# Patient Record
Sex: Male | Born: 1945 | Hispanic: No | Marital: Married | Smoking: Never smoker
Health system: Southern US, Community
[De-identification: ages and names within clinical notes are randomized; demographics above are authoritative.]

## PROBLEM LIST (undated history)

## (undated) DIAGNOSIS — T7840XA Allergy, unspecified, initial encounter: Secondary | ICD-10-CM

## (undated) DIAGNOSIS — R001 Bradycardia, unspecified: Secondary | ICD-10-CM

## (undated) DIAGNOSIS — H409 Unspecified glaucoma: Secondary | ICD-10-CM

## (undated) DIAGNOSIS — Z8739 Personal history of other diseases of the musculoskeletal system and connective tissue: Secondary | ICD-10-CM

## (undated) DIAGNOSIS — M5430 Sciatica, unspecified side: Secondary | ICD-10-CM

## (undated) DIAGNOSIS — K648 Other hemorrhoids: Secondary | ICD-10-CM

## (undated) DIAGNOSIS — R972 Elevated prostate specific antigen [PSA]: Secondary | ICD-10-CM

## (undated) DIAGNOSIS — J309 Allergic rhinitis, unspecified: Secondary | ICD-10-CM

## (undated) DIAGNOSIS — E785 Hyperlipidemia, unspecified: Secondary | ICD-10-CM

## (undated) DIAGNOSIS — Z8619 Personal history of other infectious and parasitic diseases: Secondary | ICD-10-CM

## (undated) HISTORY — DX: Hyperlipidemia, unspecified: E78.5

## (undated) HISTORY — DX: Elevated prostate specific antigen (PSA): R97.20

## (undated) HISTORY — DX: Bradycardia, unspecified: R00.1

## (undated) HISTORY — DX: Other hemorrhoids: K64.8

## (undated) HISTORY — PX: COLONOSCOPY: SHX174

## (undated) HISTORY — DX: Allergic rhinitis, unspecified: J30.9

## (undated) HISTORY — DX: Allergy, unspecified, initial encounter: T78.40XA

## (undated) HISTORY — DX: Personal history of other infectious and parasitic diseases: Z86.19

---
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
## 2006-04-15 LAB — HM COLONOSCOPY

## 2012-01-25 LAB — HEPATIC FUNCTION PANEL
ALT: 15 U/L (ref 10–40)
Alkaline Phosphatase: 59 U/L (ref 25–125)

## 2012-01-25 LAB — BASIC METABOLIC PANEL
BUN: 15 mg/dL (ref 4–21)
Creatinine: 0.8 mg/dL (ref 0.6–1.3)
Potassium: 4.4 mmol/L (ref 3.4–5.3)
Sodium: 139 mmol/L (ref 137–147)

## 2012-01-25 LAB — LIPID PANEL
Cholesterol: 180 mg/dL (ref 0–200)
LDL Cholesterol: 105 mg/dL
Triglycerides: 87 mg/dL (ref 40–160)

## 2012-03-17 LAB — HM COLONOSCOPY: HM Colonoscopy: NORMAL

## 2012-07-31 ENCOUNTER — Ambulatory Visit (INDEPENDENT_AMBULATORY_CARE_PROVIDER_SITE_OTHER): Payer: 59 | Admitting: Internal Medicine

## 2012-07-31 ENCOUNTER — Ambulatory Visit (INDEPENDENT_AMBULATORY_CARE_PROVIDER_SITE_OTHER)
Admission: RE | Admit: 2012-07-31 | Discharge: 2012-07-31 | Disposition: A | Discharge status: Home / Self Care | Payer: 59 | Source: Ambulatory Visit | Attending: Internal Medicine | Admitting: Internal Medicine

## 2012-07-31 ENCOUNTER — Encounter: Payer: Self-pay | Admitting: Internal Medicine

## 2012-07-31 VITALS — BP 142/74 | HR 71 | Temp 98.5°F | Ht 72.0 in | Wt 186.8 lb

## 2012-07-31 DIAGNOSIS — J189 Pneumonia, unspecified organism: Secondary | ICD-10-CM

## 2012-07-31 DIAGNOSIS — J309 Allergic rhinitis, unspecified: Secondary | ICD-10-CM

## 2012-07-31 MED ORDER — CETIRIZINE HCL 10 MG PO TABS: 10.0000 mg | ORAL_TABLET | Freq: Every day | ORAL | Status: DC

## 2012-07-31 MED ORDER — AZITHROMYCIN 250 MG PO TABS: ORAL_TABLET | ORAL | Status: DC

## 2012-07-31 NOTE — Progress Notes (Signed)
Subjective:    Patient ID: Adam Fleming, male    DOB: 1945-01-17, 67 y.o.   MRN: [id number]  HPI  New patient to me in my practice, here for acute "cold" symptoms Appointment to establish care in December 2014 reviewed, recent move to [place] area from [place] to be close to family  Patient presents today with cold symptoms x 1 week. 1 week ago, he woke up with a sore throat and cough. He has also had sneezing, rhinorrhea, sinus pressure, nasal congestion, and bilateral ear pain and pressure. The sore throat has improved over the past week, but the cough has progressed. He began to feel better 2 days ago, but yesterday he began to feel worse with body aches, fatigue, and worsening cough productive of yellow sputum. He has been taking mucinex with some relief of congestion. He denies fever and chills, SOB and wheeze, but +exposure to ill contacts. Patient claims that he has had problems with allergies and sinuses for the past 4-5 years but he does not take any medications for this. He denies history of COPD and asthma, and he is a never smoker.  Past Medical History  Diagnosis Date  . History of chicken pox    Family History  Problem Relation Age of Onset  . Colon cancer Mother   . Heart disease Father     had valve replacement  . Colon cancer Maternal Aunt   . Colon cancer Maternal Grandmother    History  Substance Use Topics  . Smoking status: Never Smoker   . Smokeless tobacco: Not on file  . Alcohol Use: Yes    Review of Systems  Constitutional: Positive for fever (LGF) and fatigue. Negative for unexpected weight change.  HENT: Positive for sneezing, postnasal drip and sinus pressure. Negative for ear pain.   Respiratory: Positive for cough. Negative for chest tightness, shortness of breath and wheezing.   Cardiovascular: Negative for chest pain, palpitations and leg swelling.  Gastrointestinal: Negative for nausea, vomiting, abdominal pain, diarrhea and  constipation.  Skin: Negative for rash.  Neurological: Negative for headaches.  Otherwise negative or as stated in HPI      Objective:   Physical Exam  Constitutional: He is oriented to person, place, and time. He appears well-developed and well-nourished.  HENT:  Head: Normocephalic and atraumatic.  Right Ear: External ear normal.  Left Ear: External ear normal.  TM's- clear effusion bilaterally Nose- nasal turbinates are edematous and erythematous   Eyes: EOM are normal. Pupils are equal, round, and reactive to light. Right eye exhibits no discharge. Left eye exhibits no discharge.  Neck: Normal range of motion. Neck supple. No thyromegaly present.  Mildly tender cervical adenopathy noted in anterior chain bilaterally  Cardiovascular: Normal rate and regular rhythm.   Pulmonary/Chest: Breath sounds normal. No respiratory distress. He has no wheezes.  Mild scattered crackles noted in LLL  Neurological: He is alert and oriented to person, place, and time.  Skin: Skin is warm and dry.  Psychiatric: He has a normal mood and affect.   BP 142/74  Pulse 71  Temp(Src) 98.5 F (36.9 C) (Oral)  Ht 6' (1.829 m)  Wt 186 lb 12.8 oz (84.732 kg)  BMI 25.33 kg/m2  SpO2 97%       Assessment & Plan:  1. Atypical pneumonia- CXR was ordered. Will treat with Zithromax; if CXR reveals infiltrate, will consider additional antibiotic coverage.   2. Allergic rhinitis- OTC Zyrtec was reccommended daily for his allergy symptoms.  [name], [name]  I have personally reviewed this case with PA student. I also personally examined this patient. I agree with history and findings as documented above. I reviewed, discussed and approve of the assessment and plan as listed above. Rene Paci, MD

## 2012-07-31 NOTE — Patient Instructions (Signed)
It was good to see you today. We have reviewed your prior records including labs and tests today Test(s) ordered today. Your results will be released to MyChart (or called to you) after review, usually within 72hours after test completion. If any changes need to be made, you will be notified at that same time. Medications reviewed and updated Zpak antibiotics and start daily Zyrtec for allergies - Your prescription(s) have been submitted to your pharmacy. Please take as directed and contact our office if you believe you are having problem(s) with the medication(s). Alternate between ibuprofen and tylenol for aches, pain and fever symptoms as discussed Hydrate, rest and call if worse or unimproved

## 2012-10-08 ENCOUNTER — Other Ambulatory Visit: Payer: Self-pay | Admitting: Internal Medicine

## 2012-12-04 ENCOUNTER — Ambulatory Visit: Payer: Self-pay | Admitting: Internal Medicine

## 2012-12-08 ENCOUNTER — Encounter: Payer: Self-pay | Admitting: Internal Medicine

## 2012-12-08 ENCOUNTER — Ambulatory Visit (INDEPENDENT_AMBULATORY_CARE_PROVIDER_SITE_OTHER): Payer: Medicare Other | Admitting: Internal Medicine

## 2012-12-08 VITALS — BP 132/80 | HR 54 | Temp 98.3°F | Ht 72.0 in | Wt 191.8 lb

## 2012-12-08 DIAGNOSIS — M2550 Pain in unspecified joint: Secondary | ICD-10-CM

## 2012-12-08 DIAGNOSIS — R413 Other amnesia: Secondary | ICD-10-CM | POA: Insufficient documentation

## 2012-12-08 DIAGNOSIS — R1310 Dysphagia, unspecified: Secondary | ICD-10-CM | POA: Insufficient documentation

## 2012-12-08 NOTE — Progress Notes (Signed)
Pre-visit discussion using our clinic review tool. No additional management support is needed unless otherwise documented below in the visit note.  

## 2012-12-08 NOTE — Patient Instructions (Signed)
It was good to see you today.  We have reviewed your prior records including labs and tests today  Medications reviewed and updated, no changes recommended at this time.  Ok to use Prilosec for swallow issues x 2 weeks - let me know if worsening or unimproved  Use Tylenol 2x/day for arthritis symptoms - Aleve or ibuprofen as needed if unimproved Tylenol  Check about tetanus - due every 10 years  Pneumonia vaccine after age 65

## 2012-12-08 NOTE — Progress Notes (Signed)
Subjective:    Patient ID: Adam Fleming, male    DOB: October 08, 1945, 67 y.o.   MRN: [id number]  HPI  "new" patient - here to establish PCP - Seen previously spring 2014 as a work in for allergies Brought copy of lab work from January 2014 physical done in [place] prior to moving to [place]  Concerned about dysphasia. Reports sticking sensation after eating certain solids. Not consistent with every meal. No regurgitation. No unexplained weight loss, reflux. No EGD performed. No symptoms with liquids  Concerned about diffuse arthralgias. Denies joint swelling. Fracture hands and currently left shoulder. History of lateral hip discomfort and sciatica, currently without flare. Uses occasional ibuprofen with good relief. Denies family history of rheumatologic disease  Concerned about potential memory loss. Reports ongoing progression of "less sharp"decision making. Sites inability to recall names and occasional word finding difficulty. Reports no family members or friends have noted problems. No late payments or financial errors, no driving errors  Past Medical History  Diagnosis Date  . History of chicken pox   . Allergic rhinitis    Family History  Problem Relation Age of Onset  . Colon cancer Mother 46  . Heart disease Father 79    Aortic valve replacement  . Colon cancer Maternal Aunt 65  . Colon cancer Maternal Grandmother    History  Substance Use Topics  . Smoking status: Never Smoker   . Smokeless tobacco: Not on file  . Alcohol Use: Yes    Review of Systems     Objective:   Physical Exam BP 132/80  Pulse 54  Temp(Src) 98.3 F (36.8 C) (Oral)  Ht 6' (1.829 m)  Wt 191 lb 12.8 oz (87 kg)  BMI 26.01 kg/m2  SpO2 98% Wt Readings from Last 3 Encounters:  12/08/12 191 lb 12.8 oz (87 kg)  07/31/12 186 lb 12.8 oz (84.732 kg)   Constitutional: he appears well-developed and well-nourished. No distress.  Neck: Normal range of motion. Neck supple. No JVD present.  No thyromegaly present.  Cardiovascular: Normal rate, regular rhythm and normal heart sounds.  No murmur heard. No BLE edema. Pulmonary/Chest: Effort normal and breath sounds normal. No respiratory distress. he has no wheezes.  Musculoskeletal: no synovitis of hands, wrists, knees, feet. no joint effusions. No gross deformities. Full range of motion, nontender palpation. Back: full range of motion of thoracic and lumbar spine. Non tender to palpation. Negative straight leg raise. DTR's are symmetrically intact. Sensation intact in all dermatomes of the lower extremities. Full strength to manual muscle testing. patient is able to heel toe walk without difficulty and ambulates with antalgic gait. Neurological: he is alert and oriented to person, place, and time. No cranial nerve deficit. Coordination, balance, strength, speech and gait are normal.  Skin: Skin is warm and dry. No rash noted. No erythema.  Psychiatric: he has a normal mood and affect. behavior is normal. Judgment and thought content normal.  Lab Results  Component Value Date   WBC 4.7 01/25/2012   HGB 13.7 01/25/2012   PLT 285 01/25/2012   CHOL 180 01/25/2012   TRIG 87 01/25/2012   HDL 58 01/25/2012   LDLCALC 105 01/25/2012   ALT 15 01/25/2012   AST 18 01/25/2012   NA 139 01/25/2012   K 4.4 01/25/2012   CREATININE 0.8 01/25/2012   BUN 15 01/25/2012   TSH 1.54 01/25/2012       Assessment & Plan:    See problem list. Medications and labs reviewed today.  Time spent with pt today 30 minutes, greater than 50% time spent counseling patient on dysphasia, arthralgias, concerns for memory loss and medication review. Also review of prior records from prior PCP brought with patient today

## 2012-12-08 NOTE — Assessment & Plan Note (Signed)
No abnormal findings on exam. 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 Recommend over-the-counter scheduled Tylenol, continued ibuprofen or Naprosyn as needed

## 2012-12-08 NOTE — Assessment & Plan Note (Signed)
Occasional symptoms with solids Not progressive, infrequent events Discussed possibility of esophagitis versus stricture No red flags and history, declines need for GI evaluation this time Patient will try over-the-counter PPI for possible esophagitis. If progressive or worsening symptoms, patient will call for referral to GI as needed

## 2012-12-08 NOTE — Assessment & Plan Note (Signed)
Mild cognitive deficits per patient -word finding and naming recall Neuro exam and MMSE normal Reassurance provided. Patient will discuss with family and continue to monitor. Patient will call if progressive concerns or if noted by others. Reviewed recent and normal labs January 2014 for metabolic concern

## 2013-06-19 ENCOUNTER — Ambulatory Visit (INDEPENDENT_AMBULATORY_CARE_PROVIDER_SITE_OTHER): Payer: Medicare Other | Admitting: Internal Medicine

## 2013-06-19 ENCOUNTER — Other Ambulatory Visit (INDEPENDENT_AMBULATORY_CARE_PROVIDER_SITE_OTHER): Payer: Medicare Other

## 2013-06-19 ENCOUNTER — Encounter: Payer: Self-pay | Admitting: Internal Medicine

## 2013-06-19 VITALS — BP 120/68 | HR 52 | Temp 97.7°F | Ht 72.0 in | Wt 187.8 lb

## 2013-06-19 DIAGNOSIS — Z0000 Encounter for general adult medical examination without abnormal findings: Secondary | ICD-10-CM | POA: Diagnosis not present

## 2013-06-19 DIAGNOSIS — Z87898 Personal history of other specified conditions: Secondary | ICD-10-CM

## 2013-06-19 DIAGNOSIS — Z23 Encounter for immunization: Secondary | ICD-10-CM | POA: Diagnosis not present

## 2013-06-19 DIAGNOSIS — Z136 Encounter for screening for cardiovascular disorders: Secondary | ICD-10-CM

## 2013-06-19 DIAGNOSIS — R5383 Other fatigue: Secondary | ICD-10-CM

## 2013-06-19 DIAGNOSIS — R5381 Other malaise: Secondary | ICD-10-CM

## 2013-06-19 DIAGNOSIS — R351 Nocturia: Secondary | ICD-10-CM

## 2013-06-19 DIAGNOSIS — Z79899 Other long term (current) drug therapy: Secondary | ICD-10-CM

## 2013-06-19 DIAGNOSIS — Z125 Encounter for screening for malignant neoplasm of prostate: Secondary | ICD-10-CM

## 2013-06-19 LAB — CBC WITH DIFFERENTIAL/PLATELET
BASOS PCT: 0.5 % (ref 0.0–3.0)
Basophils Absolute: 0 10*3/uL (ref 0.0–0.1)
EOS ABS: 0.1 10*3/uL (ref 0.0–0.7)
EOS PCT: 1.5 % (ref 0.0–5.0)
HCT: 44 % (ref 39.0–52.0)
Hemoglobin: 14.6 g/dL (ref 13.0–17.0)
LYMPHS PCT: 39.3 % (ref 12.0–46.0)
Lymphs Abs: 2 10*3/uL (ref 0.7–4.0)
MCHC: 33.2 g/dL (ref 30.0–36.0)
MCV: 88 fl (ref 78.0–100.0)
Monocytes Absolute: 0.5 10*3/uL (ref 0.1–1.0)
Monocytes Relative: 9.7 % (ref 3.0–12.0)
NEUTROS ABS: 2.5 10*3/uL (ref 1.4–7.7)
NEUTROS PCT: 49 % (ref 43.0–77.0)
Platelets: 253 10*3/uL (ref 150.0–400.0)
RBC: 5 Mil/uL (ref 4.22–5.81)
RDW: 13 % (ref 11.5–15.5)
WBC: 5.2 10*3/uL (ref 4.0–10.5)

## 2013-06-19 LAB — URINALYSIS, ROUTINE W REFLEX MICROSCOPIC
HGB URINE DIPSTICK: NEGATIVE
Ketones, ur: 15 — AB
Leukocytes, UA: NEGATIVE
Nitrite: NEGATIVE
RBC / HPF: NONE SEEN (ref 0–?)
Specific Gravity, Urine: 1.02 (ref 1.000–1.030)
TOTAL PROTEIN, URINE-UPE24: NEGATIVE
URINE GLUCOSE: NEGATIVE
Urobilinogen, UA: 0.2 (ref 0.0–1.0)
WBC UA: NONE SEEN (ref 0–?)
pH: 6 (ref 5.0–8.0)

## 2013-06-19 LAB — BASIC METABOLIC PANEL
BUN: 14 mg/dL (ref 6–23)
CALCIUM: 9.5 mg/dL (ref 8.4–10.5)
CHLORIDE: 100 meq/L (ref 96–112)
CO2: 29 mEq/L (ref 19–32)
Creatinine, Ser: 0.9 mg/dL (ref 0.4–1.5)
GFR: 91.01 mL/min (ref >60.00)
Glucose, Bld: 92 mg/dL (ref 70–99)
Potassium: 4.2 mEq/L (ref 3.5–5.1)
Sodium: 137 mEq/L (ref 135–145)

## 2013-06-19 LAB — LIPID PANEL
CHOL/HDL RATIO: 3
Cholesterol: 192 mg/dL (ref 0–200)
HDL: 70.3 mg/dL (ref >39.00)
LDL CALC: 111 mg/dL — AB (ref 0–99)
NonHDL: 121.7
TRIGLYCERIDES: 53 mg/dL (ref 0.0–149.0)
VLDL: 10.6 mg/dL (ref 0.0–40.0)

## 2013-06-19 LAB — HEPATIC FUNCTION PANEL
ALT: 23 U/L (ref 0–53)
AST: 27 U/L (ref 0–37)
Albumin: 4.5 g/dL (ref 3.5–5.2)
Alkaline Phosphatase: 58 U/L (ref 39–117)
Bilirubin, Direct: 0.2 mg/dL (ref 0.0–0.3)
TOTAL PROTEIN: 7.1 g/dL (ref 6.0–8.3)
Total Bilirubin: 1.8 mg/dL — ABNORMAL HIGH (ref 0.2–1.2)

## 2013-06-19 LAB — TSH: TSH: 1.12 u[IU]/mL (ref 0.35–4.50)

## 2013-06-19 LAB — PSA: PSA: 4.98 ng/mL — AB (ref 0.10–4.00)

## 2013-06-19 NOTE — Patient Instructions (Addendum)
It was good to see you today.  We have reviewed your prior records including labs and tests today  Health Maintenance reviewed - tetanus and Pneumovax updated today - all other recommended immunizations and age-appropriate screenings are up-to-date.  we will send to your prior provider(s) for "release of records" as discussed today.  Test(s) ordered today. Your results will be released to [facility] (or called to you) after review, usually within 72hours after test completion. If any changes need to be made, you will be notified at that same time.  Medications reviewed and updated, no changes recommended at this time.  Please schedule followup in 12 months for annual exam and labs, call sooner if problems.   Health Maintenance, Males A healthy lifestyle and preventative care can promote health and wellness.  Maintain regular health, dental, and eye exams.  Eat a healthy diet. Foods like vegetables, fruits, whole grains, low-fat dairy products, and lean protein foods contain the nutrients you need and are low in calories. Decrease your intake of foods high in solid fats, added sugars, and salt. Get information about a proper diet from your health care provider, if necessary.  Regular physical exercise is one of the most important things you can do for your health. Most adults should get at least 150 minutes of moderate-intensity exercise (any activity that increases your heart rate and causes you to sweat) each week. In addition, most adults need muscle-strengthening exercises on 2 or more days a week.   Maintain a healthy weight. The body mass index (BMI) is a screening tool to identify possible weight problems. It provides an estimate of body fat based on height and weight. Your health care provider can find your BMI and can help you achieve or maintain a healthy weight. For males 20 years and older:  A BMI below 18.5 is considered underweight.  A BMI of 18.5 to 24.9 is normal.  A BMI of  25 to 29.9 is considered overweight.  A BMI of 30 and above is considered obese.  Maintain normal blood lipids and cholesterol by exercising and minimizing your intake of saturated fat. Eat a balanced diet with plenty of fruits and vegetables. Blood tests for lipids and cholesterol should begin at age 71 and be repeated every 5 years. If your lipid or cholesterol levels are high, you are over 50, or you are at high risk for heart disease, you may need your cholesterol levels checked more frequently.Ongoing high lipid and cholesterol levels should be treated with medicines, if diet and exercise are not working.  If you smoke, find out from your health care provider how to quit. If you do not use tobacco, do not start.  Lung cancer screening is recommended for adults aged 47 80 years who are at high risk for developing lung cancer because of a history of smoking. A yearly low-dose CT scan of the lungs is recommended for people who have at least a 30-pack-year history of smoking and are a current smoker or have quit within the past 15 years. A pack year of smoking is smoking an average of 1 pack of cigarettes a day for 1 year (for example, a 30-pack-year history of smoking could mean smoking 1 pack a day for 30 years or 2 packs a day for 15 years). Yearly screening should continue until the smoker has stopped smoking for at least 15 years. Yearly screening should be stopped for people who develop a health problem that would prevent them from having lung cancer  treatment.  If you choose to drink alcohol, do not have more than 2 drinks per day. One drink is considered to be 12 oz (360 mL) of beer, 5 oz (150 mL) of wine, or 1.5 oz (45 mL) of liquor.  Avoid use of street drugs. Do not share needles with anyone. Ask for help if you need support or instructions about stopping the use of drugs.  High blood pressure causes heart disease and increases the risk of stroke. Blood pressure should be checked at least  every 1 2 years. Ongoing high blood pressure should be treated with medicines if weight loss and exercise are not effective.  If you are 23 66 years old, ask your health care provider if you should take aspirin to prevent heart disease.  Diabetes screening involves taking a blood sample to check your fasting blood sugar level. This should be done once every 3 years after age 102, if you are at a normal weight and without risk factors for diabetes. Testing should be considered at a younger age or be carried out more frequently if you are overweight and have at least 1 risk factor for diabetes.  Colorectal cancer can be detected and often prevented. Most routine colorectal cancer screening begins at the age of 37 and continues through age 31. However, your health care provider may recommend screening at an earlier age if you have risk factors for colon cancer. On a yearly basis, your health care provider may provide home test kits to check for hidden blood in the stool. A small camera at the end of a tube may be used to directly examine the colon (sigmoidoscopy or colonoscopy) to detect the earliest forms of colorectal cancer. Talk to your health care provider about this at age 46, when routine screening begins. A direct exam of the colon should be repeated every 5 10 years through age 33, unless early forms of pre-cancerous polyps or small growths are found.  People who are at an increased risk for hepatitis B should be screened for this virus. You are considered at high risk for hepatitis B if:  You were born in a country where hepatitis B occurs often. Talk with your health care provider about which countries are considered high-risk.  Your parents were born in a high-risk country and you have not received a shot to protect against hepatitis B (hepatitis B vaccine).  You have HIV or AIDS.  You use needles to inject street drugs.  You live with, or have sex with, someone who has hepatitis B.  You  are a man who has sex with other men (MSM).  You get hemodialysis treatment.  You take certain medicines for conditions like cancer, organ transplantation, and autoimmune conditions.  Hepatitis C blood testing is recommended for all people born from [birth year] through 1965 and any individual with known risk factors for hepatitis C.  Healthy men should no longer receive prostate-specific antigen (PSA) blood tests as part of routine cancer screening. Talk to your health care provider about prostate cancer screening.  Testicular cancer screening is not recommended for adolescents or adult males who have no symptoms. Screening includes self-exam, a health care provider exam, and other screening tests. Consult with your health care provider about any symptoms you have or any concerns you have about testicular cancer.  Practice safe sex. Use condoms and avoid high-risk sexual practices to reduce the spread of sexually transmitted infections (STIs).  Use sunscreen. Apply sunscreen liberally and repeatedly throughout the day.  You should seek shade when your shadow is shorter than you. Protect yourself by wearing long sleeves, pants, a wide-brimmed hat, and sunglasses year round, whenever you are outdoors.  Tell your health care provider of new moles or changes in moles, especially if there is a change in shape or color. Also tell your provider if a mole is larger than the size of a pencil eraser.  A one-time screening for abdominal aortic aneurysm (AAA) and surgical repair of large AAAs by ultrasound is recommended for men aged 78 75 years who are current or former smokers.  Stay current with your vaccines (immunizations). Document Released: 06/19/2007 Document Revised: 10/11/2012 Document Reviewed: 05/18/2010 [facility] Patient Information 2014 [place], [place].

## 2013-06-19 NOTE — Progress Notes (Signed)
Pre visit review using our clinic review tool, if applicable. No additional management support is needed unless otherwise documented below in the visit note. 

## 2013-06-19 NOTE — Progress Notes (Signed)
Subjective:    Patient ID: Adam Fleming, male    DOB: 1945/07/10, 68 y.o.   MRN: [id number]  HPI   Here for medicare wellness and annual physical  Diet: heart healthy  Physical activity: sedentary Depression/mood screen: negative Hearing: intact to whispered voice Visual acuity: grossly normal, performs annual eye exam  ADLs: capable Fall risk: none Home safety: good Cognitive evaluation: intact to orientation, naming, recall and repetition EOL planning: adv directives, full code/ I agree  I have personally reviewed and have noted 1. The patient's medical and social history 2. Their use of alcohol, tobacco or illicit drugs 3. Their current medications and supplements 4. The patient's functional ability including ADL's, fall risks, home safety risks and hearing or visual impairment. 5. Diet and physical activities 6. Evidence for depression or mood disorders  Also reviewed chronic medical issues and interval medical events  Past Medical History  Diagnosis Date  . History of chicken pox   . Allergic rhinitis    Family History  Problem Relation Age of Onset  . Colon cancer Mother 19  . Heart disease Father 35    Aortic valve replacement  . Colon cancer Maternal Aunt 44  . Colon cancer Maternal Grandmother    History  Substance Use Topics  . Smoking status: Never Smoker   . Smokeless tobacco: Not on file  . Alcohol Use: Yes    Review of Systems  Constitutional: Positive for fatigue. Negative for fever, activity change, appetite change and unexpected weight change.  Respiratory: Negative for cough, chest tightness, shortness of breath and wheezing.   Cardiovascular: Negative for chest pain, palpitations and leg swelling.  Musculoskeletal: Positive for arthralgias.  Neurological: Negative for dizziness, weakness and headaches.  Psychiatric/Behavioral: Negative for dysphoric mood. The patient is not nervous/anxious.   All other systems reviewed and are  negative.      Objective:   Physical Exam  BP 120/68  Pulse 52  Temp(Src) 97.7 F (36.5 C) (Oral)  Ht 6' (1.829 m)  Wt 187 lb 12.8 oz (85.186 kg)  BMI 25.46 kg/m2  SpO2 97% Wt Readings from Last 3 Encounters:  06/19/13 187 lb 12.8 oz (85.186 kg)  12/08/12 191 lb 12.8 oz (87 kg)  07/31/12 186 lb 12.8 oz (84.732 kg)   Constitutional: he appears well-developed and well-nourished. No distress.  HENT: Head: Normocephalic and atraumatic. Ears: B TMs ok, no erythema or effusion; Nose: Nose normal. Mouth/Throat: Oropharynx is clear and moist. No oropharyngeal exudate.  Eyes: Conjunctivae and EOM are normal. Pupils are equal, round, and reactive to light. No scleral icterus.  Neck: Normal range of motion. Neck supple. No JVD present. No thyromegaly present.  Cardiovascular: Normal rate, regular rhythm and normal heart sounds.  No murmur heard. No BLE edema. Pulmonary/Chest: Effort normal and breath sounds normal. No respiratory distress. he has no wheezes.  Abdominal: Soft. Bowel sounds are normal. he exhibits no distension. There is no tenderness. no masses GU: defer to uro Musculoskeletal: Normal range of motion, no joint effusions. No gross deformities Neurological: he is alert and oriented to person, place, and time. No cranial nerve deficit. Coordination, balance, strength, speech and gait are normal.  Skin: Skin is warm and dry. No rash noted. No erythema.  Psychiatric: he has a normal mood and affect. behavior is normal. Judgment and thought content normal.   Lab Results  Component Value Date   WBC 4.7 01/25/2012   HGB 13.7 01/25/2012   PLT 285 01/25/2012   CHOL 180  01/25/2012   TRIG 87 01/25/2012   HDL 58 01/25/2012   LDLCALC 105 01/25/2012   ALT 15 01/25/2012   AST 18 01/25/2012   NA 139 01/25/2012   K 4.4 01/25/2012   CREATININE 0.8 01/25/2012   BUN 15 01/25/2012   TSH 1.54 01/25/2012    Dg Chest 2 View  07/31/2012   *RADIOLOGY REPORT*  Clinical Data: Left lower lobe crackles  and productive sputum.  CHEST - 2 VIEW  Comparison: None.  Findings: Cardiomediastinal contours are within normal limits. Mild basilar opacity, seen best on the lateral view.  No pleural effusion or pneumothorax.  No acute osseous abnormality.  IMPRESSION: Mild basilar airspace opacity, seen best on the lateral view.  This may represent pneumonia versus atelectasis.   Original Report Authenticated By: Donavan Burnet, M.D.   ECG: sinus bradycardia at 56 beats per minute. 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 Immunizations reviewed and up to date or declined. Labs/ECG reviewed. Risk factors for depression reviewed and negative. Hearing function and visual acuity are intact. ADLs screened and addressed as needed. Functional ability and level of safety reviewed and appropriate. Education, counseling and referrals performed based on assessed risks today. Patient provided with a copy of personalized plan for preventive services.  Fatigue - nonspecific symptoms/exam - check screening labs  Hx elevated PSA - describes prior urologic evaluation, but no prior biopsy or TURP. Denies symptoms of BPH. Check PSA now. Arrange referral to urology if remains elevated

## 2013-07-03 ENCOUNTER — Encounter: Payer: Self-pay | Admitting: Internal Medicine

## 2013-07-03 DIAGNOSIS — R972 Elevated prostate specific antigen [PSA]: Secondary | ICD-10-CM

## 2013-08-20 ENCOUNTER — Encounter: Payer: Self-pay | Admitting: Internal Medicine

## 2013-11-13 ENCOUNTER — Encounter: Payer: Self-pay | Admitting: Internal Medicine

## 2013-11-13 DIAGNOSIS — R972 Elevated prostate specific antigen [PSA]: Secondary | ICD-10-CM

## 2013-11-23 ENCOUNTER — Encounter: Payer: Self-pay | Admitting: Internal Medicine

## 2013-11-28 ENCOUNTER — Encounter: Payer: Self-pay | Admitting: Family Medicine

## 2013-11-28 ENCOUNTER — Other Ambulatory Visit (INDEPENDENT_AMBULATORY_CARE_PROVIDER_SITE_OTHER): Payer: Medicare Other

## 2013-11-28 ENCOUNTER — Ambulatory Visit (INDEPENDENT_AMBULATORY_CARE_PROVIDER_SITE_OTHER): Payer: Medicare Other | Admitting: Family Medicine

## 2013-11-28 VITALS — BP 134/80 | HR 57 | Ht 72.0 in | Wt 192.0 lb

## 2013-11-28 DIAGNOSIS — M19019 Primary osteoarthritis, unspecified shoulder: Secondary | ICD-10-CM | POA: Insufficient documentation

## 2013-11-28 DIAGNOSIS — R972 Elevated prostate specific antigen [PSA]: Secondary | ICD-10-CM

## 2013-11-28 DIAGNOSIS — M25511 Pain in right shoulder: Secondary | ICD-10-CM

## 2013-11-28 DIAGNOSIS — M259 Joint disorder, unspecified: Secondary | ICD-10-CM | POA: Insufficient documentation

## 2013-11-28 LAB — PSA: PSA: 6.39 ng/mL — AB (ref 0.10–4.00)

## 2013-11-28 NOTE — Progress Notes (Signed)
[name] [place], [place] [address] Phone: [phone] Subjective:    I'm seeing this patient by the request  of:    CC: Right shoulder pain  ATF:[id number] Adam Fleming is a 68 y.o. male coming in with complaint of right shoulder pain. Patient has had this pain for multiple months but it seems to be worsening. Patient states reaching across his body or holding something for long amount of time to some more discomfort on the anterior as well as sometimes posterior portions of his shoulder. Patient states this seems to be localized.-year-old over ulnar-sided does give him some pain as well. Patient has not tried any significant home modalities at this time. States that ibuprofen does help when he does take it. Patient states he can wake him up at night. Patient puts the severity of 5 out of 10. Denies any radiation down the arm or any numbness tingling or weakness.     Past medical history, social, surgical and family history all reviewed in electronic medical record.   Review of Systems: No headache, visual changes, nausea, vomiting, diarrhea, constipation, dizziness, abdominal pain, skin rash, fevers, chills, night sweats, weight loss, swollen lymph nodes, body aches, joint swelling, muscle aches, chest pain, shortness of breath, mood changes.   Objective Blood pressure 134/80, pulse 57, height 6' (1.829 m), weight 192 lb (87.091 kg), SpO2 98 %.  General: No apparent distress alert and oriented x3 mood and affect normal, dressed appropriately.  HEENT: Pupils equal, extraocular movements intact  Respiratory: Patient's speak in full sentences and does not appear short of breath  Cardiovascular: No lower extremity edema, non tender, no erythema  Skin: Warm dry intact with no signs of infection or rash on extremities or on axial skeleton.  Abdomen: Soft nontender  Neuro: Cranial nerves II through XII are intact, neurovascularly intact in all  extremities with 2+ DTRs and 2+ pulses.  Lymph: No lymphadenopathy of posterior or anterior cervical chain or axillae bilaterally.  Gait normal with good balance and coordination.  MSK:  Non tender with full range of motion and good stability and symmetric strength and tone of  elbows, wrist, hip, knee and ankles bilaterally.  Shoulder: Right Inspection reveals no abnormalities, atrophy or asymmetry. Palpation reveals before meals joint tenderness ROM is full in all planes passively. Rotator cuff strength normal throughout. signs of impingement with positive Neer and Hawkin's tests, but negative empty can sign. Speeds and Yergason's tests normal. Questionable positive O'Briens mostly seems to be surrounding the before meals joint Normal scapular function observed. No painful arc and no drop arm sign. No apprehension sign  MSK US performed of: Right This study was ordered, performed, and interpreted by Charlann Boxer D.O.  Shoulder:   Supraspinatus:  Appears normal on long and transverse views, Bursal bulge seen with shoulder abduction on impingement view. Infraspinatus:  Appears normal on long and transverse views. Significant increase in Doppler flow Subscapularis:  Appears normal on long and transverse views. Positive bursa Teres Minor:  Appears normal on long and transverse views. AC joint:  Moderate to severe osteophytic changes Glenohumeral Joint:  Appears normal without effusion. Glenoid Labrum: Question will tear noted Biceps Tendon:  Appears normal on long and transverse views, no fraying of tendon, tendon located in intertubercular groove, no subluxation with shoulder internal or external rotation.  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  Verbal informed consent obtained.  Time-out conducted.  Noted no overlying erythema, induration, or other signs of local infection.  Skin prepped in a sterile fashion.  Local anesthesia: Topical Ethyl chloride.  With sterile technique and under real time ultrasound guidance:  Joint visualized.  23g 1  inch needle inserted superior approach. Pictures taken for needle placement. Patient did have injection of 1 cc  of 0.5% Marcaine, and 1.0 cc of Kenalog 40 mg/dL. Completed without difficulty  Pain immediately resolved suggesting accurate placement of the medication.  Advised to call if fevers/chills, erythema, induration, drainage, or persistent bleeding.  Images permanently stored and available for review in the ultrasound unit.  Impression: Technically successful ultrasound guided injection.     Impression and Recommendations:     This case required medical decision making of moderate complexity.

## 2013-11-28 NOTE — Assessment & Plan Note (Signed)
Patient was given an injection today with approximately 50% improvement afterwards. Patient does have moderate to severe osteophytic changes of this joint. Differential also includes a potential labral tear but I think this is unlikely based on patient's symptomatology. We discussed icing protocol, and topical anti-inflammatory's, home exercise program and what activities are beneficial and which ones to avoid. Patient will come back and see me again in 3-4 weeks for further evaluation. If he continues to have pain we can consider an intra-articular injection of the shoulder for diagnostic and hopefully therapeutic purposes. We can also consider formal physical therapy but I do not think that this will likely be necessary.

## 2013-11-28 NOTE — Patient Instructions (Signed)
Good to meet you Ice 20 minutes 2 times daily. Usually after activity and before bed. Exercises 3 times a week.  Pennsaid twice daily as needed Tylenol 650 mg up to 3 times a day is safe.  Come back in 3 weeks to make sure you are doing better.

## 2013-12-01 ENCOUNTER — Encounter: Payer: Self-pay | Admitting: Internal Medicine

## 2013-12-01 DIAGNOSIS — R972 Elevated prostate specific antigen [PSA]: Secondary | ICD-10-CM

## 2013-12-19 ENCOUNTER — Encounter: Payer: Self-pay | Admitting: Family Medicine

## 2013-12-19 ENCOUNTER — Ambulatory Visit (INDEPENDENT_AMBULATORY_CARE_PROVIDER_SITE_OTHER): Payer: Medicare Other | Admitting: Family Medicine

## 2013-12-19 VITALS — BP 120/80 | HR 49 | Ht 72.0 in | Wt 188.0 lb

## 2013-12-19 DIAGNOSIS — M19019 Primary osteoarthritis, unspecified shoulder: Secondary | ICD-10-CM

## 2013-12-19 DIAGNOSIS — M259 Joint disorder, unspecified: Secondary | ICD-10-CM

## 2013-12-19 NOTE — Progress Notes (Signed)
  [facility] [place],  [address] Phone: [phone] Subjective:     CC: Right shoulder pain follow up  XKP:[id number] Adam Fleming is a 68 y.o. male coming in with complaint of right shoulder pain. Patient was found to have some arthritic changes as well as some inner substance rotator cuff degenerative changes. Patient elected to try an intra-articular acromioclavicular injection, home exercises, icing protocol. Patient states he is approximate 95% better and he has not been icing or doing the exercises regularly. Patient did get the over-the-counter medications which has been helpful. Denies any new symptoms. States that the pain seems to be improving overall. Still some mild discomfort when reaching up and across his body but only intermittently.     Past medical history, social, surgical and family history all reviewed in electronic medical record.   Review of Systems: No headache, visual changes, nausea, vomiting, diarrhea, constipation, dizziness, abdominal pain, skin rash, fevers, chills, night sweats, weight loss, swollen lymph nodes, body aches, joint swelling, muscle aches, chest pain, shortness of breath, mood changes.   Objective Blood pressure 120/80, pulse 49, height 6' (1.829 m), weight 188 lb (85.276 kg), SpO2 98 %.  General: No apparent distress alert and oriented x3 mood and affect normal, dressed appropriately.  HEENT: Pupils equal, extraocular movements intact  Respiratory: Patient's speak in full sentences and does not appear short of breath  Cardiovascular: No lower extremity edema, non tender, no erythema  Skin: Warm dry intact with no signs of infection or rash on extremities or on axial skeleton.  Abdomen: Soft nontender  Neuro: Cranial nerves II through XII are intact, neurovascularly intact in all extremities with 2+ DTRs and 2+ pulses.  Lymph: No lymphadenopathy of posterior or anterior cervical chain or  axillae bilaterally.  Gait normal with good balance and coordination.  MSK:  Non tender with full range of motion and good stability and symmetric strength and tone of  elbows, wrist, hip, knee and ankles bilaterally.  Shoulder: Right Inspection reveals no abnormalities, atrophy or asymmetry. Palpation reveals before meals joint tenderness ROM is full in all planes passively. Rotator cuff strength normal throughout. Minimal signs of impingement still noted Speeds and Yergason's tests normal. Negative O'Briens  Normal scapular function observed. No painful arc and no drop arm sign. No apprehension sign       Impression and Recommendations:     This case required medical decision making of moderate complexity.

## 2013-12-19 NOTE — Patient Instructions (Signed)
Good to see you Ice 20 minutes 2 times daily. Usually after activity and before bed.  Exercises 3 times a week.  For at least another 6 weeks.  Vitamin D 2000 IU daily.  You are doing great See me again in 6 weeks.

## 2013-12-19 NOTE — Assessment & Plan Note (Signed)
Patient is doing considerably better at this time. Encourage him to do the exercises and did give a more challenging exercises and showed proper technique today. We discussed icing protocol as well as over-the-counter medications that can be helpful. His lungs patient continues to improve we will continue with conservative therapy. I would like patient to return known 6 weeks for further evaluation and treatment.  Spent greater than 25 minutes with patient face-to-face and had greater than 50% of counseling including as described above in assessment and plan.

## 2014-01-30 ENCOUNTER — Ambulatory Visit: Payer: Medicare Other | Admitting: Family Medicine

## 2014-02-22 ENCOUNTER — Telehealth: Payer: Self-pay

## 2014-02-22 NOTE — Telephone Encounter (Signed)
Spoke with pt regarding flu vaccine   Pt brought up that he was wondering since PCP time is lessened to primary care. Pt loves current PCP but is wondering if there is a MD that is recommended at the [facility] office in [place] (pref male)?   Pt requested response via mychart.

## 2014-02-22 NOTE — Telephone Encounter (Signed)
LVM for pt to call back.   RE: Flu Vaccine for 2015/2016 season 

## 2014-02-22 NOTE — Telephone Encounter (Signed)
Pt called back.   Flu shot record updated.

## 2014-02-22 NOTE — Telephone Encounter (Signed)
MyC msg on same sent thanks

## 2014-04-03 NOTE — Telephone Encounter (Signed)
Spoke with [name] at the [place] office and she is sending Adam Fleming a message to ask if he will except a New Pt transfer.

## 2014-04-03 NOTE — Telephone Encounter (Signed)
Stef, Will you see if you can get an appointment to establish care at [facility] with Thayne or Correll - prefer sooner than later, but please schedule appt regardless of timing? If not soon, acute visit with Marya Amsler or Linus Orn here would be fine pending his appt to establish care at [facility] with male provider. Thanks!

## 2014-04-03 NOTE — Telephone Encounter (Signed)
Called pt and pt requested to be scheduled with Dr. Larose Kells due to new PCP reduced availibility. I have pt scheduled for May 12th New Pt appt. I did explain that I will need to confirm that Dr. Larose Kells will take on new pts and if there is an issue I will call back and we would look at another avenue.

## 2014-04-04 ENCOUNTER — Telehealth: Payer: Self-pay

## 2014-04-06 NOTE — Telephone Encounter (Signed)
I have not specifically heard if Larose Kells will accept this patient in transfer - I will copy Jose on this note now to confirm [facility], this is a very nice man who prefers a male provider close to [employer] -  As my clinic schedule has reduced availability, he feels now is a good time to change and has requested you. Will you be willing to take him onto your panel? Thanks for considering!

## 2014-04-07 NOTE — Telephone Encounter (Signed)
Hello Adam Fleming, absolutely, I'll take him as a new patient, in fact I answer this via message last week. Thank you for the referral, will cc this note to my nurse to arrange a visit.

## 2014-04-08 ENCOUNTER — Encounter: Payer: Self-pay | Admitting: Internal Medicine

## 2014-04-08 ENCOUNTER — Ambulatory Visit (INDEPENDENT_AMBULATORY_CARE_PROVIDER_SITE_OTHER): Payer: Medicare Other | Admitting: Internal Medicine

## 2014-04-08 ENCOUNTER — Other Ambulatory Visit (INDEPENDENT_AMBULATORY_CARE_PROVIDER_SITE_OTHER): Payer: Medicare Other

## 2014-04-08 VITALS — BP 130/80 | HR 60 | Temp 97.4°F | Resp 16 | Wt 189.0 lb

## 2014-04-08 DIAGNOSIS — R55 Syncope and collapse: Secondary | ICD-10-CM

## 2014-04-08 DIAGNOSIS — I951 Orthostatic hypotension: Secondary | ICD-10-CM

## 2014-04-08 DIAGNOSIS — R001 Bradycardia, unspecified: Secondary | ICD-10-CM | POA: Diagnosis not present

## 2014-04-08 LAB — CBC WITH DIFFERENTIAL/PLATELET
BASOS ABS: 0 10*3/uL (ref 0.0–0.1)
Basophils Relative: 0.7 % (ref 0.0–3.0)
EOS ABS: 0 10*3/uL (ref 0.0–0.7)
Eosinophils Relative: 0.7 % (ref 0.0–5.0)
HEMATOCRIT: 45.5 % (ref 39.0–52.0)
Hemoglobin: 15.3 g/dL (ref 13.0–17.0)
LYMPHS ABS: 1.8 10*3/uL (ref 0.7–4.0)
Lymphocytes Relative: 31.5 % (ref 12.0–46.0)
MCHC: 33.6 g/dL (ref 30.0–36.0)
MCV: 86.8 fl (ref 78.0–100.0)
MONO ABS: 0.5 10*3/uL (ref 0.1–1.0)
MONOS PCT: 8.7 % (ref 3.0–12.0)
NEUTROS PCT: 58.4 % (ref 43.0–77.0)
Neutro Abs: 3.3 10*3/uL (ref 1.4–7.7)
PLATELETS: 255 10*3/uL (ref 150.0–400.0)
RBC: 5.24 Mil/uL (ref 4.22–5.81)
RDW: 12.9 % (ref 11.5–15.5)
WBC: 5.7 10*3/uL (ref 4.0–10.5)

## 2014-04-08 LAB — BASIC METABOLIC PANEL
BUN: 14 mg/dL (ref 6–23)
CO2: 31 mEq/L (ref 19–32)
Calcium: 9.9 mg/dL (ref 8.4–10.5)
Chloride: 103 mEq/L (ref 96–112)
Creatinine, Ser: 0.78 mg/dL (ref 0.40–1.50)
GFR: 105.72 mL/min (ref >60.00)
Glucose, Bld: 87 mg/dL (ref 70–99)
Potassium: 4.7 mEq/L (ref 3.5–5.1)
Sodium: 139 mEq/L (ref 135–145)

## 2014-04-08 LAB — TSH: TSH: 2.05 u[IU]/mL (ref 0.35–4.50)

## 2014-04-08 NOTE — Patient Instructions (Addendum)
It was good to see you today.  We have reviewed your prior records including labs and tests today  Test(s) ordered today. Your results will be released to [place] (or called to you) after review, usually within 72hours after test completion. If any changes need to be made, you will be notified at that same time.  Medications reviewed and updated, no changes recommended at this time.  we'll make referral to cardiology for evaluation of your symptoms and stress test as needed . Our office will contact you regarding appointment(s) once made.  Please schedule followup with me in 3-4 months, call sooner if problems.  Orthostatic Hypotension Orthostatic hypotension is a sudden drop in blood pressure. It happens when you quickly stand up from a seated or lying position. You may feel dizzy or light-headed. This can last for just a few seconds or for up to a few minutes. It is usually not a serious problem. However, if this happens frequently or gets worse, it can be a sign of something more serious. CAUSES  Different things can cause orthostatic hypotension, including:   Loss of body fluids (dehydration).  Medicines that lower blood pressure.  Sudden changes in posture, such as standing up quickly after you have been sitting or lying down.  Taking too much of your medicine. SIGNS AND SYMPTOMS   Light-headedness or dizziness.   Fainting or near-fainting.   A fast heart rate.   Weakness.   Feeling tired (fatigue).  DIAGNOSIS  Your health care provider may do several things to help diagnose your condition and identify the cause. These may include:   Taking a medical history and doing a physical exam.  Checking your blood pressure. Your health care provider will check your blood pressure when you are:  Lying down.  Sitting.  Standing.  Using tilt table testing. In this test, you lie down on a table that moves from a lying position to a standing position. You will be strapped  onto the table. This test monitors your blood pressure and heart rate when you are in different positions. TREATMENT  Treatment will vary depending on the cause. Possible treatments include:   Changing the dosage of your medicines.  Wearing compression stockings on your lower legs.  Standing up slowly after sitting or lying down.  Eating more salt.  Eating frequent, small meals.  In some cases, getting IV fluids.  Taking medicine to enhance fluid retention. HOME CARE INSTRUCTIONS  Only take over-the-counter or prescription medicines as directed by your health care provider.  Follow your health care provider's instructions for changing the dosage of your current medicines.  Do not stop or adjust your medicine on your own.  Stand up slowly after sitting or lying down. This allows your body to adjust to the different position.  Wear compression stockings as directed.  Eat extra salt as directed.  Do not add extra salt to your diet unless directed to by your health care provider.  Eat frequent, small meals.  Avoid standing suddenly after eating.  Avoid hot showers or excessive heat as directed by your health care provider.  Keep all follow-up appointments. SEEK MEDICAL CARE IF:  You continue to feel dizzy or light-headed after standing.  You feel groggy or confused.  You feel cold, clammy, or sick to your stomach (nauseous).  You have blurred vision.  You feel short of breath. SEEK IMMEDIATE MEDICAL CARE IF:   You faint after standing.  You have chest pain.  You have difficulty breathing.  You lose feeling or movement in your arms or legs.   You have slurred speech or difficulty talking, or you are unable to talk.  MAKE SURE YOU:   Understand these instructions.  Will watch your condition.  Will get help right away if you are not doing well or get worse. Document Released: 12/11/2001 Document Revised: 12/26/2012 Document Reviewed:  10/13/2012 [facility] Patient Information 2015 [place], [place]. This information is not intended to replace advice given to you by your health care provider. Make sure you discuss any questions you have with your health care provider.

## 2014-04-08 NOTE — Telephone Encounter (Signed)
Pt has NP appt scheduled with Dr. Larose Kells 05/16/2014 at 1045.

## 2014-04-08 NOTE — Progress Notes (Signed)
Subjective:    Patient ID: Adam Fleming, male    DOB: 07-25-1945, 69 y.o.   MRN: [id number]  HPI  Patient here for lightheaded symptoms, with position change multiple days of last week. Symptoms now improved, but concerned due to the intensity and frequency of lightheadedness last week. Denies GI loss or other dehydration. No medication changes.  Past Medical History  Diagnosis Date  . History of chicken pox   . Allergic rhinitis     Review of Systems  Constitutional: Positive for fatigue.  Respiratory: Positive for shortness of breath (occ in mornings, not positional or exertional). Negative for cough.   Cardiovascular: Negative for chest pain, palpitations and leg swelling.  Neurological: Positive for dizziness (orthostatic symptoms upon position change from sitting to standing multiple times last week, now resolved) and light-headedness. Negative for tremors, syncope, weakness and numbness.       Objective:    Physical Exam  Constitutional: He appears well-developed and well-nourished. No distress.  Cardiovascular: Normal rate, regular rhythm and normal heart sounds.   No murmur heard. Pulmonary/Chest: Effort normal and breath sounds normal. No respiratory distress.    BP 130/80 mmHg  Pulse 60  Temp(Src) 97.4 F (36.3 C) (Oral)  Resp 16  Wt 189 lb (85.73 kg)  SpO2 98% Wt Readings from Last 3 Encounters:  04/08/14 189 lb (85.73 kg)  12/19/13 188 lb (85.276 kg)  11/28/13 192 lb (87.091 kg)     Lab Results  Component Value Date   WBC 5.2 06/19/2013   HGB 14.6 06/19/2013   HCT 44.0 06/19/2013   PLT 253.0 06/19/2013   GLUCOSE 92 06/19/2013   CHOL 192 06/19/2013   TRIG 53.0 06/19/2013   HDL 70.30 06/19/2013   LDLCALC 111* 06/19/2013   ALT 23 06/19/2013   AST 27 06/19/2013   NA 137 06/19/2013   K 4.2 06/19/2013   CL 100 06/19/2013   CREATININE 0.9 06/19/2013   BUN 14 06/19/2013   CO2 29 06/19/2013   TSH 1.12 06/19/2013   PSA 6.39* 11/28/2013     Dg Chest 2 View  07/31/2012   *RADIOLOGY REPORT*  Clinical Data: Left lower lobe crackles and productive sputum.  CHEST - 2 VIEW  Comparison: None.  Findings: Cardiomediastinal contours are within normal limits. Mild basilar opacity, seen best on the lateral view.  No pleural effusion or pneumothorax.  No acute osseous abnormality.  IMPRESSION: Mild basilar airspace opacity, seen best on the lateral view.  This may represent pneumonia versus atelectasis.   Original Report Authenticated By: Donavan Burnet, M.D.   ECG today: sinus brady at 47 bpm - PR .24     Assessment & Plan:   Near syncope with described orthostatic hypotension last week. Borderline/bradycardia with first-degree AV block. June 2015 ECG reviewed with PR 0.23 Repeat today unchanged, no ischemic changes or noted arrhythmia  Check labs Refer to cardiology for evaluation of same to arrange further imaging as needed Encouraged hydration and liberalization of sodium use Information on orthostatic hypotension provided If labs and cardiac evaluation unremarkable, also explore potential of emotional stress given ongoing treatment of wife with new diagnosis breast cancer, emotional support provided today  Problem List Items Addressed This Visit    None    Visit Diagnoses    Orthostatic hypotension    -  Primary    Relevant Orders    Basic metabolic panel    CBC with Differential/Platelet    TSH    Ambulatory referral to Cardiology  Bradycardia        Relevant Orders    Basic metabolic panel    CBC with Differential/Platelet    TSH    Ambulatory referral to Cardiology    Near syncope        Relevant Orders    Basic metabolic panel    CBC with Differential/Platelet    TSH    Ambulatory referral to Cardiology        Gwendolyn Grant, MD

## 2014-04-08 NOTE — Progress Notes (Signed)
Pre visit review using our clinic review tool, if applicable. No additional management support is needed unless otherwise documented below in the visit note. 

## 2014-04-22 ENCOUNTER — Telehealth: Payer: Self-pay | Admitting: Internal Medicine

## 2014-04-22 NOTE — Telephone Encounter (Signed)
Patient called regarding his referral to a cardiologist. Its been a couple weeks and he was hoping to get in before his wife has surgery beginning of May

## 2014-04-23 ENCOUNTER — Encounter: Payer: Self-pay | Admitting: Internal Medicine

## 2014-04-23 NOTE — Telephone Encounter (Signed)
This referral is in queue for the cardiology office at [facility]. They are currently working on this and will schedule directly with patient.

## 2014-04-24 NOTE — Telephone Encounter (Signed)
Please be sure York Cerise and Aaron Edelman have seen this concern with delay in arranging timely consults One of the two of them should followup with pt directly - please let me know when this has been done thanks

## 2014-05-16 ENCOUNTER — Ambulatory Visit: Payer: No Typology Code available for payment source | Admitting: Internal Medicine

## 2014-05-28 ENCOUNTER — Encounter: Payer: Self-pay | Admitting: Physician Assistant

## 2014-05-28 ENCOUNTER — Ambulatory Visit (INDEPENDENT_AMBULATORY_CARE_PROVIDER_SITE_OTHER): Payer: Medicare Other | Admitting: Physician Assistant

## 2014-05-28 VITALS — BP 147/62 | HR 53 | Temp 97.6°F | Ht 72.0 in | Wt 185.6 lb

## 2014-05-28 DIAGNOSIS — J069 Acute upper respiratory infection, unspecified: Secondary | ICD-10-CM

## 2014-05-28 MED ORDER — BENZONATATE 200 MG PO CAPS: 200.0000 mg | ORAL_CAPSULE | Freq: Three times a day (TID) | ORAL | Status: DC | PRN

## 2014-05-28 NOTE — Assessment & Plan Note (Signed)
Mild symptoms x 1 days. Exam reveals no evidence of bacterial infection.  Supportive measures discussed - see AVS.  Rx Tessalon for cough.  Follow-up PRN.

## 2014-05-28 NOTE — Patient Instructions (Signed)
Please stay well hydrated. Get plenty of rest.  Saline nasal spray to flush out sinuses.  Humidifier in bedroom.  Take Mucinex-D twice daily as directed for congestion.  Use Tessalon as directed if needed for cough.  Follow-up if symptoms are not improving.

## 2014-05-28 NOTE — Progress Notes (Signed)
Pre visit review using our clinic review tool, if applicable. No additional management support is needed unless otherwise documented below in the visit note. 

## 2014-05-28 NOTE — Progress Notes (Signed)
Patient presents to clinic today c/o 1 days of sinus pressure, nasal congestion, PND.  Endorses wife and grandson with the same symptoms.  Denies fever, chills, SOB or chest pain.  Has mild dry cough since this morning.  Denies recent travel.  Past Medical History  Diagnosis Date  . History of chicken pox   . Allergic rhinitis     Current Outpatient Prescriptions on File Prior to Visit  Medication Sig Dispense Refill  . Cholecalciferol (VITAMIN D-3) 1000 UNITS CAPS Take 1,000 Units by mouth daily.    . Multiple Vitamins-Minerals (MENS MULTI VITAMIN & MINERAL) TABS Take by mouth daily.    . Turmeric 450 MG CAPS Take 450 mg by mouth daily.     No current facility-administered medications on file prior to visit.    No Known Allergies  Family History  Problem Relation Age of Onset  . Colon cancer Mother 15  . Heart disease Father 33    Aortic valve replacement  . Colon cancer Maternal Aunt 84  . Colon cancer Maternal Grandmother     History   Social History  . Marital Status: Married    Spouse Name: N/A  . Number of Children: N/A  . Years of Education: N/A   Social History Main Topics  . Smoking status: Never Smoker   . Smokeless tobacco: Not on file  . Alcohol Use: Yes  . Drug Use: No  . Sexual Activity: Not on file   Other Topics Concern  . None   Social History Narrative    Review of Systems - See HPI.  All other ROS are negative.  BP 147/62 mmHg  Pulse 53  Temp(Src) 97.6 F (36.4 C) (Oral)  Ht 6' (1.829 m)  Wt 185 lb 9.6 oz (84.188 kg)  BMI 25.17 kg/m2  SpO2 100%  Physical Exam  Constitutional: He is oriented to person, place, and time and well-developed, well-nourished, and in no distress.  HENT:  Head: Normocephalic and atraumatic.  Right Ear: External ear normal.  Left Ear: External ear normal.  Nose: Mucosal edema and rhinorrhea present. Right sinus exhibits no maxillary sinus tenderness and no frontal sinus tenderness. Left sinus exhibits no  maxillary sinus tenderness and no frontal sinus tenderness.  Mouth/Throat: Uvula is midline, oropharynx is clear and moist and mucous membranes are normal. No oropharyngeal exudate.  Eyes: Conjunctivae are normal. Pupils are equal, round, and reactive to light.  Neck: Neck supple.  Cardiovascular: Normal rate, regular rhythm, normal heart sounds and intact distal pulses.   Pulmonary/Chest: Effort normal and breath sounds normal. No respiratory distress. He has no wheezes. He has no rales. He exhibits no tenderness.  Lymphadenopathy:    He has no cervical adenopathy.  Neurological: He is alert and oriented to person, place, and time.  Skin: Skin is warm and dry. No rash noted.  Psychiatric: Affect normal.  Vitals reviewed.   Recent Results (from the past 2160 hour(s))  Basic metabolic panel     Status: None   Collection Time: 04/08/14 10:22 AM  Result Value Ref Range   Sodium 139 135 - 145 mEq/L   Potassium 4.7 3.5 - 5.1 mEq/L   Chloride 103 96 - 112 mEq/L   CO2 31 19 - 32 mEq/L   Glucose, Bld 87 70 - 99 mg/dL   BUN 14 6 - 23 mg/dL   Creatinine, Ser 0.78 0.40 - 1.50 mg/dL   Calcium 9.9 8.4 - 10.5 mg/dL   GFR 105.72 >60.00 mL/min  CBC  with Differential/Platelet     Status: None   Collection Time: 04/08/14 10:22 AM  Result Value Ref Range   WBC 5.7 4.0 - 10.5 K/uL   RBC 5.24 4.22 - 5.81 Mil/uL   Hemoglobin 15.3 13.0 - 17.0 g/dL   HCT 45.5 39.0 - 52.0 %   MCV 86.8 78.0 - 100.0 fl   MCHC 33.6 30.0 - 36.0 g/dL   RDW 12.9 11.5 - 15.5 %   Platelets 255.0 150.0 - 400.0 K/uL   Neutrophils Relative % 58.4 43.0 - 77.0 %   Lymphocytes Relative 31.5 12.0 - 46.0 %   Monocytes Relative 8.7 3.0 - 12.0 %   Eosinophils Relative 0.7 0.0 - 5.0 %   Basophils Relative 0.7 0.0 - 3.0 %   Neutro Abs 3.3 1.4 - 7.7 K/uL   Lymphs Abs 1.8 0.7 - 4.0 K/uL   Monocytes Absolute 0.5 0.1 - 1.0 K/uL   Eosinophils Absolute 0.0 0.0 - 0.7 K/uL   Basophils Absolute 0.0 0.0 - 0.1 K/uL  TSH     Status: None    Collection Time: 04/08/14 10:22 AM  Result Value Ref Range   TSH 2.05 0.35 - 4.50 uIU/mL    Assessment/Plan: Viral URI Mild symptoms x 1 days. Exam reveals no evidence of bacterial infection.  Supportive measures discussed - see AVS.  Rx Tessalon for cough.  Follow-up PRN.

## 2014-05-29 ENCOUNTER — Ambulatory Visit: Payer: No Typology Code available for payment source | Admitting: Internal Medicine

## 2014-06-17 ENCOUNTER — Ambulatory Visit (INDEPENDENT_AMBULATORY_CARE_PROVIDER_SITE_OTHER): Payer: Medicare Other | Admitting: Internal Medicine

## 2014-06-17 VITALS — BP 140/80 | HR 59 | Ht 72.0 in | Wt 156.0 lb

## 2014-06-17 DIAGNOSIS — R55 Syncope and collapse: Secondary | ICD-10-CM

## 2014-06-17 NOTE — Patient Instructions (Signed)
Dr Debara Pickett recommends that you follow-up appointment as needed.   Chronotropic Incompetence

## 2014-06-18 ENCOUNTER — Encounter: Payer: Self-pay | Admitting: Internal Medicine

## 2014-06-18 DIAGNOSIS — R55 Syncope and collapse: Secondary | ICD-10-CM | POA: Insufficient documentation

## 2014-06-18 NOTE — Progress Notes (Signed)
OFFICE NOTE  Chief Complaint:  Pre-syncope  Primary Care Physician: Gwendolyn Grant, MD  HPI:  Adam Fleming is a pleasant 69 year old male who is really from [place]. He has no significant cardiac problems. There is no significant family history of heart disease. Recently he had some lightheadedness and presyncope. He also had bradycardia. Heart rate is about 59 and he reports is always been fairly low. 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 He denies any chest pain or worsening shortness of breath. He is physically active and reports his heart rate goes up some but not significantly with exertion. He has had no further presyncopal episodes and attributed the this to possible viral infection.  PMHx:  Past Medical History  Diagnosis Date  . History of chicken pox   . Allergic rhinitis     Past Surgical History  Procedure Laterality Date  . Tonsillectomy  1955    FAMHx:  Family History  Problem Relation Age of Onset  . Colon cancer Mother 46  . Heart disease Father 58    Aortic valve replacement  . Colon cancer Maternal Aunt 70  . Colon cancer Maternal Grandmother     SOCHx:   reports that he has never smoked. He does not have any smokeless tobacco history on file. He reports that he drinks alcohol. He reports that he does not use illicit drugs.  ALLERGIES:  No Known Allergies  ROS: A comprehensive review of systems was negative except for: Cardiovascular: positive for near-syncope  HOME MEDS: Current Outpatient Prescriptions  Medication Sig Dispense Refill  . Cholecalciferol (VITAMIN D-3) 1000 UNITS CAPS Take 1,000 Units by mouth daily.    . Multiple Vitamins-Minerals (MENS MULTI VITAMIN & MINERAL) TABS Take by mouth daily.    . Turmeric 450 MG CAPS Take 450 mg by mouth daily.     No current facility-administered medications for this visit.    LABS/IMAGING: No results found for this or any previous visit (from the past  48 hour(s)). No results found.  WEIGHTS: Wt Readings from Last 3 Encounters:  06/17/14 156 lb (70.761 kg)  05/28/14 185 lb 9.6 oz (84.188 kg)  04/08/14 189 lb (85.73 kg)    VITALS: BP 140/80 mmHg  Pulse 59  Ht 6' (1.829 m)  Wt 156 lb (70.761 kg)  BMI 21.15 kg/m2  EXAM: General appearance: alert and no distress Neck: no carotid bruit and no JVD Lungs: clear to auscultation bilaterally Heart: regular rate and rhythm, S1, S2 normal, no murmur, click, rub or gallop Abdomen: soft, non-tender; bowel sounds normal; no masses,  no organomegaly Extremities: extremities normal, atraumatic, no cyanosis or edema Pulses: 2+ and symmetric Skin: Skin color, texture, turgor normal. No rashes or lesions Neurologic: Grossly normal Psych: Pleasant  EKG: Sinus bradycardia 59  ASSESSMENT:  1.   Near-syncope, possibly due to dehydration or viral infection 2.   Asymptomatic bradycardia  PLAN: 1.    [name] has asymptomatic bradycardia. I do not feel like this is plan a role in his presyncope. This may been just a viral infection. He's had no further chest pain or shortness of breath. There is only very mild abnormality to his EKG. No further stress testing isn't indicated at this time. I have recommended watching his heart rate and the blood pressure as well as symptoms with exercise that should he find new symptoms that are concerning for angina, he should contact me for follow-up.  The kind referral. Follow-up  with me as needed.  Pixie Casino, MD, [facility] Attending Cardiologist Seth Ward 06/18/2014, 6:04 PM

## 2014-07-16 ENCOUNTER — Encounter: Payer: Self-pay | Admitting: Internal Medicine

## 2014-07-16 ENCOUNTER — Ambulatory Visit (INDEPENDENT_AMBULATORY_CARE_PROVIDER_SITE_OTHER): Payer: Medicare Other | Admitting: Internal Medicine

## 2014-07-16 ENCOUNTER — Other Ambulatory Visit (INDEPENDENT_AMBULATORY_CARE_PROVIDER_SITE_OTHER): Payer: Medicare Other

## 2014-07-16 VITALS — BP 128/70 | HR 48 | Temp 97.6°F | Ht 72.0 in | Wt 156.5 lb

## 2014-07-16 DIAGNOSIS — R972 Elevated prostate specific antigen [PSA]: Secondary | ICD-10-CM | POA: Insufficient documentation

## 2014-07-16 DIAGNOSIS — Z0000 Encounter for general adult medical examination without abnormal findings: Secondary | ICD-10-CM | POA: Diagnosis not present

## 2014-07-16 DIAGNOSIS — E785 Hyperlipidemia, unspecified: Secondary | ICD-10-CM | POA: Insufficient documentation

## 2014-07-16 DIAGNOSIS — R634 Abnormal weight loss: Secondary | ICD-10-CM | POA: Diagnosis not present

## 2014-07-16 DIAGNOSIS — R1310 Dysphagia, unspecified: Secondary | ICD-10-CM

## 2014-07-16 LAB — BASIC METABOLIC PANEL
BUN: 15 mg/dL (ref 6–23)
CO2: 32 mEq/L (ref 19–32)
CREATININE: 0.75 mg/dL (ref 0.40–1.50)
Calcium: 9.5 mg/dL (ref 8.4–10.5)
Chloride: 103 mEq/L (ref 96–112)
GFR: 110.52 mL/min (ref >60.00)
GLUCOSE: 89 mg/dL (ref 70–99)
Potassium: 4.4 mEq/L (ref 3.5–5.1)
SODIUM: 141 meq/L (ref 135–145)

## 2014-07-16 LAB — LIPID PANEL
CHOL/HDL RATIO: 3
Cholesterol: 200 mg/dL (ref 0–200)
HDL: 70.6 mg/dL (ref >39.00)
LDL CALC: 115 mg/dL — AB (ref 0–99)
NonHDL: 129.4
Triglycerides: 73 mg/dL (ref 0.0–149.0)
VLDL: 14.6 mg/dL (ref 0.0–40.0)

## 2014-07-16 LAB — URINALYSIS, ROUTINE W REFLEX MICROSCOPIC
BILIRUBIN URINE: NEGATIVE
HGB URINE DIPSTICK: NEGATIVE
Ketones, ur: NEGATIVE
Leukocytes, UA: NEGATIVE
Nitrite: NEGATIVE
RBC / HPF: NONE SEEN (ref 0–?)
Specific Gravity, Urine: 1.015 (ref 1.000–1.030)
Total Protein, Urine: NEGATIVE
UROBILINOGEN UA: 0.2 (ref 0.0–1.0)
Urine Glucose: NEGATIVE
pH: 7.5 (ref 5.0–8.0)

## 2014-07-16 LAB — TSH: TSH: 1.59 u[IU]/mL (ref 0.35–4.50)

## 2014-07-16 LAB — PSA: PSA: 3.83 ng/mL (ref 0.10–4.00)

## 2014-07-16 NOTE — Assessment & Plan Note (Signed)
Reviewed uro note 01/2014 - WNL on specialized eval No BPH or obstructive symptoms on hx Check PSA and follow up uro if persisting abn or increasing trend

## 2014-07-16 NOTE — Progress Notes (Signed)
Pre visit review using our clinic review tool, if applicable. No additional management support is needed unless otherwise documented below in the visit note. 

## 2014-07-16 NOTE — Assessment & Plan Note (Signed)
Occasional symptoms with solids Not progressive, infrequent events, no regurgitation - but first discussed with med 12/2012 (>38mo ago) Discussed possibility of esophagitis versus stricture - felt symptoms unimproved with 30d OTC PPI when tried same last year Given significant weight loss (though arguably intentional), will refer for GI evaluation this time

## 2014-07-16 NOTE — Progress Notes (Signed)
Subjective:    Patient ID: Adam Fleming, male    DOB: [birth year], 69 y.o.   MRN: [id number]  HPI   Here for medicare wellness  Diet: heart healthy  Physical activity: sedentary Depression/mood screen: negative Hearing: intact to whispered voice Visual acuity: grossly normal, performs annual eye exam  ADLs: capable Fall risk: none Home safety: good Cognitive evaluation: intact to orientation, naming, recall and repetition EOL planning: adv directives, full code/ I agree  I have personally reviewed and have noted 1. The patient's medical and social history 2. Their use of alcohol, tobacco or illicit drugs 3. Their current medications and supplements 4. The patient's functional ability including ADL's, fall risks, home safety risks and hearing or visual impairment. 5. Diet and physical activities 6. Evidence for depression or mood disorders  Also reviewed chronic medical conditions, interval events occurred concerns  Past Medical History  Diagnosis Date  . History of chicken pox   . Allergic rhinitis   . Bradycardia     asx, cards eval 06/2014  . Abnormal PSA     eval by uro 01/2014 - WNL, monitor   Family History  Problem Relation Age of Onset  . Colon cancer Mother 3  . Heart disease Father 68    Aortic valve replacement  . Colon cancer Maternal Aunt 11  . Colon cancer Maternal Grandmother   . Colonic polyp Father    History  Substance Use Topics  . Smoking status: Never Smoker   . Smokeless tobacco: Not on file  . Alcohol Use: Yes    Review of Systems  Constitutional: Negative for fever, activity change, appetite change, fatigue and unexpected weight change (intentional weight loss thru diet and exercise).  Respiratory: Negative for cough, chest tightness, shortness of breath and wheezing.   Cardiovascular: Negative for chest pain, palpitations and leg swelling.  Neurological: Positive for dizziness (occassional, usually positional and episodes  intermittent) and light-headedness (occ - see dizzy). Negative for weakness and headaches.  Psychiatric/Behavioral: Negative for sleep disturbance and dysphoric mood. The patient is not nervous/anxious.   All other systems reviewed and are negative.   Patient Care Team: Rowe Clack, MD as PCP - General (Internal Medicine) Pixie Casino, MD (Cardiology) Carolan Clines, MD (Urology)     Objective:    Physical Exam  Constitutional: He is oriented to person, place, and time. He appears well-developed and well-nourished. No distress.  Cardiovascular: Regular rhythm and normal heart sounds.   No murmur heard. brady  Pulmonary/Chest: Effort normal and breath sounds normal. No respiratory distress.  Neurological: He is alert and oriented to person, place, and time.  Psychiatric: He has a normal mood and affect. His behavior is normal. Judgment and thought content normal.    BP 128/70 mmHg  Pulse 48  Temp(Src) 97.6 F (36.4 C) (Oral)  Ht 6' (1.829 m)  Wt 156 lb 8 oz (70.988 kg)  BMI 21.22 kg/m2  SpO2 97% Wt Readings from Last 3 Encounters:  07/16/14 156 lb 8 oz (70.988 kg)  06/17/14 156 lb (70.761 kg)  05/28/14 185 lb 9.6 oz (84.188 kg)    Lab Results  Component Value Date   WBC 5.7 04/08/2014   HGB 15.3 04/08/2014   HCT 45.5 04/08/2014   PLT 255.0 04/08/2014   GLUCOSE 87 04/08/2014   CHOL 192 06/19/2013   TRIG 53.0 06/19/2013   HDL 70.30 06/19/2013   LDLCALC 111* 06/19/2013   ALT 23 06/19/2013   AST 27 06/19/2013   NA  139 04/08/2014   K 4.7 04/08/2014   CL 103 04/08/2014   CREATININE 0.78 04/08/2014   BUN 14 04/08/2014   CO2 31 04/08/2014   TSH 2.05 04/08/2014   PSA 6.39* 11/28/2013    Dg Chest 2 View  07/31/2012   *RADIOLOGY REPORT*  Clinical Data: Left lower lobe crackles and productive sputum.  CHEST - 2 VIEW  Comparison: None.  Findings: Cardiomediastinal contours are within normal limits. Mild basilar opacity, seen best on the lateral view.  No  pleural effusion or pneumothorax.  No acute osseous abnormality.  IMPRESSION: Mild basilar airspace opacity, seen best on the lateral view.  This may represent pneumonia versus atelectasis.   Original Report Authenticated By: Donavan Burnet, M.D.       Assessment & Plan:   Z00.00/AWV - Today patient counseled on age appropriate routine health concerns for screening and prevention, each reviewed and up to date or declined. Immunizations reviewed and up to date or declined. Labs ordered and reviewed. Risk factors for depression reviewed and negative. Hearing function and visual acuity are intact. ADLs screened and addressed as needed. Functional ability and level of safety reviewed and appropriate. Education, counseling and referrals performed based on assessed risks today. Patient provided with a copy of personalized plan for preventive services.  Problem List Items Addressed This Visit    Abnormal PSA    Reviewed uro note 01/2014 - WNL on specialized eval No BPH or obstructive symptoms on hx Check PSA and follow up uro if persisting abn or increasing trend      Relevant Orders   Basic metabolic panel   Urinalysis, Routine w reflex microscopic (not at [facility])   PSA   Dysphagia    Occasional symptoms with solids Not progressive, infrequent events, no regurgitation - but first discussed with med 12/2012 (>20mo ago) Discussed possibility of esophagitis versus stricture - felt symptoms unimproved with 30d OTC PPI when tried same last year Given significant weight loss (though arguably intentional), will refer for GI evaluation this time       Relevant Orders   Ambulatory referral to Gastroenterology   Hyperlipidemia, mild    Has hx same, remotely on statin but DC'd statin following successful significant weight loss of >50# with subsequent improvement in chol numbers Check annually and continue lifestyle modifications to control same      Relevant Orders   Lipid panel    Other Visit  Diagnoses    Routine general medical examination at a health care facility    -  Primary    Loss of weight        Relevant Orders    Basic metabolic panel    TSH    PSA    Ambulatory referral to Gastroenterology        Gwendolyn Grant, MD

## 2014-07-16 NOTE — Patient Instructions (Addendum)
It was good to see you today.  We have reviewed your prior records including labs and tests today  Health Maintenance reviewed - let us know when [facility] completes [place] immunization. All other recommended immunizations and age-appropriate screenings are up-to-date.  Test(s) ordered today. Your results will be released to [place] (or called to you) after review, usually within 72hours after test completion. If any changes need to be made, you will be notified at that same time.  we'll make referral to gastroenterology to determine if upper endoscopy needed to evaluate swallowing and to determine recommendations for next colonoscopy screening date. Our office will contact you regarding appointment(s) once made.  Medications reviewed and updated, no changes recommended at this time.  Please schedule followup in 12 months for annual exam and labs, call sooner if problems.  Health Maintenance A healthy lifestyle and preventative care can promote health and wellness.  Maintain regular health, dental, and eye exams.  Eat a healthy diet. Foods like vegetables, fruits, whole grains, low-fat dairy products, and lean protein foods contain the nutrients you need and are low in calories. Decrease your intake of foods high in solid fats, added sugars, and salt. Get information about a proper diet from your health care provider, if necessary.  Regular physical exercise is one of the most important things you can do for your health. Most adults should get at least 150 minutes of moderate-intensity exercise (any activity that increases your heart rate and causes you to sweat) each week. In addition, most adults need muscle-strengthening exercises on 2 or more days a week.   Maintain a healthy weight. The body mass index (BMI) is a screening tool to identify possible weight problems. It provides an estimate of body fat based on height and weight. Your health care provider can find your BMI and can help  you achieve or maintain a healthy weight. For males 20 years and older:  A BMI below 18.5 is considered underweight.  A BMI of 18.5 to 24.9 is normal.  A BMI of 25 to 29.9 is considered overweight.  A BMI of 30 and above is considered obese.  Maintain normal blood lipids and cholesterol by exercising and minimizing your intake of saturated fat. Eat a balanced diet with plenty of fruits and vegetables. Blood tests for lipids and cholesterol should begin at age 55 and be repeated every 5 years. If your lipid or cholesterol levels are high, you are over age 71, or you are at high risk for heart disease, you may need your cholesterol levels checked more frequently.Ongoing high lipid and cholesterol levels should be treated with medicines if diet and exercise are not working.  If you smoke, find out from your health care provider how to quit. If you do not use tobacco, do not start.  Lung cancer screening is recommended for adults aged 45-80 years who are at high risk for developing lung cancer because of a history of smoking. A yearly low-dose CT scan of the lungs is recommended for people who have at least a 30-pack-year history of smoking and are current smokers or have quit within the past 15 years. A pack year of smoking is smoking an average of 1 pack of cigarettes a day for 1 year (for example, a 30-pack-year history of smoking could mean smoking 1 pack a day for 30 years or 2 packs a day for 15 years). Yearly screening should continue until the smoker has stopped smoking for at least 15 years. Yearly screening should  be stopped for people who develop a health problem that would prevent them from having lung cancer treatment.  If you choose to drink alcohol, do not have more than 2 drinks per day. One drink is considered to be 12 oz (360 mL) of beer, 5 oz (150 mL) of wine, or 1.5 oz (45 mL) of liquor.  Avoid the use of street drugs. Do not share needles with anyone. Ask for help if you need  support or instructions about stopping the use of drugs.  High blood pressure causes heart disease and increases the risk of stroke. Blood pressure should be checked at least every 1-2 years. Ongoing high blood pressure should be treated with medicines if weight loss and exercise are not effective.  If you are 25-26 years old, ask your health care provider if you should take aspirin to prevent heart disease.  Diabetes screening involves taking a blood sample to check your fasting blood sugar level. This should be done once every 3 years after age 28 if you are at a normal weight and without risk factors for diabetes. Testing should be considered at a younger age or be carried out more frequently if you are overweight and have at least 1 risk factor for diabetes.  Colorectal cancer can be detected and often prevented. Most routine colorectal cancer screening begins at the age of 61 and continues through age 81. However, your health care provider may recommend screening at an earlier age if you have risk factors for colon cancer. On a yearly basis, your health care provider may provide home test kits to check for hidden blood in the stool. A small camera at the end of a tube may be used to directly examine the colon (sigmoidoscopy or colonoscopy) to detect the earliest forms of colorectal cancer. Talk to your health care provider about this at age 42 when routine screening begins. A direct exam of the colon should be repeated every 5-10 years through age 75, unless early forms of precancerous polyps or small growths are found.  People who are at an increased risk for hepatitis B should be screened for this virus. You are considered at high risk for hepatitis B if:  You were born in a country where hepatitis B occurs often. Talk with your health care provider about which countries are considered high risk.  Your parents were born in a high-risk country and you have not received a shot to protect against  hepatitis B (hepatitis B vaccine).  You have HIV or AIDS.  You use needles to inject street drugs.  You live with, or have sex with, someone who has hepatitis B.  You are a man who has sex with other men (MSM).  You get hemodialysis treatment.  You take certain medicines for conditions like cancer, organ transplantation, and autoimmune conditions.  Hepatitis C blood testing is recommended for all people born from [birth year] through 1963 and any individual with known risk factors for hepatitis C.  Healthy men should no longer receive prostate-specific antigen (PSA) blood tests as part of routine cancer screening. Talk to your health care provider about prostate cancer screening.  Testicular cancer screening is not recommended for adolescents or adult males who have no symptoms. Screening includes self-exam, a health care provider exam, and other screening tests. Consult with your health care provider about any symptoms you have or any concerns you have about testicular cancer.  Practice safe sex. Use condoms and avoid high-risk sexual practices to reduce the spread  of sexually transmitted infections (STIs).  You should be screened for STIs, including gonorrhea and chlamydia if:  You are sexually active and are younger than 24 years.  You are older than 24 years, and your health care provider tells you that you are at risk for this type of infection.  Your sexual activity has changed since you were last screened, and you are at an increased risk for chlamydia or gonorrhea. Ask your health care provider if you are at risk.  If you are at risk of being infected with HIV, it is recommended that you take a prescription medicine daily to prevent HIV infection. This is called pre-exposure prophylaxis (PrEP). You are considered at risk if:  You are a man who has sex with other men (MSM).  You are a heterosexual man who is sexually active with multiple partners.  You take drugs by  injection.  You are sexually active with a partner who has HIV.  Talk with your health care provider about whether you are at high risk of being infected with HIV. If you choose to begin PrEP, you should first be tested for HIV. You should then be tested every 3 months for as long as you are taking PrEP.  Use sunscreen. Apply sunscreen liberally and repeatedly throughout the day. You should seek shade when your shadow is shorter than you. Protect yourself by wearing long sleeves, pants, a wide-brimmed hat, and sunglasses year round whenever you are outdoors.  Tell your health care provider of new moles or changes in moles, especially if there is a change in shape or color. Also, tell your health care provider if a mole is larger than the size of a pencil eraser.  A one-time screening for abdominal aortic aneurysm (AAA) and surgical repair of large AAAs by ultrasound is recommended for men aged 24-75 years who are current or former smokers.  Stay current with your vaccines (immunizations). Document Released: 06/19/2007 Document Revised: 12/26/2012 Document Reviewed: 05/18/2010 [facility] Patient Information 2015 [place], [place]. This information is not intended to replace advice given to you by your health care provider. Make sure you discuss any questions you have with your health care provider. Dysphagia Swallowing problems (dysphagia) occur when solids and liquids seem to stick in your throat on the way down to your stomach, or the food takes longer to get to the stomach. Other symptoms include regurgitating food, noises coming from the throat, chest discomfort with swallowing, and a feeling of fullness or the feeling of something being stuck in your throat when swallowing. When blockage in your throat is complete, it may be associated with drooling. CAUSES  Problems with swallowing may occur because of problems with the muscles. The food cannot be propelled in the usual manner into your stomach.  You may have ulcers, scar tissue, or inflammation in the tube down which food travels from your mouth to your stomach (esophagus), which blocks food from passing normally into the stomach. Causes of inflammation include:  Acid reflux from your stomach into your esophagus.  Infection.  Radiation treatment for cancer.  Medicines taken without enough fluids to wash them down into your stomach. You may have nerve problems that prevent signals from being sent to the muscles of your esophagus to contract and move your food down to your stomach. Globus pharyngeus is a relatively common problem in which there is a sense of an obstruction or difficulty in swallowing, without any physical abnormalities of the swallowing passages being found. This problem usually  improves over time with reassurance and testing to rule out other causes. DIAGNOSIS Dysphagia can be diagnosed and its cause can be determined by tests in which you swallow a white substance that helps illuminate the inside of your throat (contrast medium) while X-rays are taken. Sometimes a flexible telescope that is inserted down your throat (endoscopy) to look at your esophagus and stomach is used. TREATMENT   If the dysphagia is caused by acid reflux or infection, medicines may be used.  If the dysphagia is caused by problems with your swallowing muscles, swallowing therapy may be used to help you strengthen your swallowing muscles.  If the dysphagia is caused by a blockage or mass, procedures to remove the blockage may be done. HOME CARE INSTRUCTIONS  Try to eat soft food that is easier to swallow and check your weight on a daily basis to be sure that it is not decreasing.  Be sure to drink liquids when sitting upright (not lying down). SEEK MEDICAL CARE IF:  You are losing weight because you are unable to swallow.  You are coughing when you drink liquids (aspiration).  You are coughing up partially digested food. SEEK IMMEDIATE  MEDICAL CARE IF:  You are unable to swallow your own saliva .  You are having shortness of breath or a fever, or both.  You have a hoarse voice along with difficulty swallowing. MAKE SURE YOU:  Understand these instructions.  Will watch your condition.  Will get help right away if you are not doing well or get worse. Document Released: 12/19/1999 Document Revised: 05/07/2013 Document Reviewed: 06/09/2012 [facility] Patient Information 2015 [place], [place]. This information is not intended to replace advice given to you by your health care provider. Make sure you discuss any questions you have with your health care provider.

## 2014-07-16 NOTE — Assessment & Plan Note (Signed)
Has hx same, remotely on statin but DC'd statin following successful significant weight loss of >50# with subsequent improvement in chol numbers Check annually and continue lifestyle modifications to control same

## 2014-08-20 ENCOUNTER — Ambulatory Visit (INDEPENDENT_AMBULATORY_CARE_PROVIDER_SITE_OTHER): Payer: Medicare Other | Admitting: Internal Medicine

## 2014-08-20 ENCOUNTER — Encounter: Payer: Self-pay | Admitting: Internal Medicine

## 2014-08-20 VITALS — BP 114/68 | HR 52 | Ht 70.67 in | Wt 188.4 lb

## 2014-08-20 DIAGNOSIS — R1310 Dysphagia, unspecified: Secondary | ICD-10-CM | POA: Diagnosis not present

## 2014-08-20 DIAGNOSIS — Z800 Family history of malignant neoplasm of digestive organs: Secondary | ICD-10-CM

## 2014-08-20 NOTE — Progress Notes (Signed)
HISTORY OF PRESENT ILLNESS:  Adam Fleming is a 69 y.o. male who is sent today by his primary care physician Dr. Ripley Fraise for evaluation of difficulty with swallowing. Patient reports a 10 year history of intermittent problems with posterior pharyngeal sticking sensation associated with coughing with eating item such as peanuts or crackers. Does not notice such symptoms when he is not eating. No difficulties with liquids. The problem may be a bit more frequent in recent years. Has brought this up to his physicians elsewhere, but no workup performed. Currently, the problem occurs several times per week. He denies heartburn or indigestion. He does not describe true esophageal dysphagia. His weight has been stable . He does have a family history of colon cancer in his mother around age 18 as well as maternal aunt. He has had multiple colonoscopies in [place], but has not had polyps. The record shows colonoscopy in 2008 and again in 2014. The examinations were normal except for internal hemorrhoids. Patient has no other GI complaints. He has moved to [place] from [place] to be with his grandchildren  REVIEW OF SYSTEMS:  All non-GI ROS negative upon complete comprehensive review of all systems  Past Medical History  Diagnosis Date  . History of chicken pox   . Allergic rhinitis   . Bradycardia     asx, cards eval 06/2014  . Abnormal PSA     eval by uro 01/2014 - WNL, monitor  . Hyperlipidemia   . Internal hemorrhoids     Past Surgical History  Procedure Laterality Date  . Tonsillectomy  1955    Social History [place]  reports that he has never smoked. He has never used smokeless tobacco. He reports that he drinks alcohol. He reports that he does not use illicit drugs.  family history includes Colon cancer in his maternal grandmother; Colon cancer (age of onset: 67) in his mother; Colon cancer (age of onset: 52) in his maternal aunt; Colonic polyp in his father; Heart  disease (age of onset: 32) in his father.  No Known Allergies     PHYSICAL EXAMINATION: Vital signs: BP 114/68 mmHg  Pulse 52  Ht 5' 10.67" (1.795 m)  Wt 188 lb 6 oz (85.446 kg)  BMI 26.52 kg/m2  Constitutional: generally well-appearing, no acute distress Psychiatric: alert and oriented x3, cooperative Eyes: extraocular movements intact, anicteric, conjunctiva pink Mouth: oral pharynx moist, no lesions Neck: supple no lymphadenopathy Cardiovascular: heart regular rate and rhythm, no murmur Lungs: clear to auscultation bilaterally Abdomen: soft, nontender, nondistended, no obvious ascites, no peritoneal signs, normal bowel sounds, no organomegaly Rectal: Omitted Extremities: no clubbing cyanosis or lower extremity edema bilaterally Skin: no lesions on visible extremities Neuro: No focal deficits.   ASSESSMENT:  #1. Chronic Choking or coughing spells intermittently with dry foods as described. Rule out cricopharyngeal ring or hypertension. Rule out transfer dysphagia.  #2. Family history of colon cancer as described. Last colonoscopy March 2014.  PLAN:  #1. Upper endoscopy with esophageal dilation if indicated.The nature of the procedure, as well as the risks, benefits, and alternatives were carefully and thoroughly reviewed with the patient. Ample time for discussion and questions allowed. The patient understood, was satisfied, and agreed to proceed. #2. May need modified barium swallow with speech pathology if endoscopy unrevealing #3. Place colonoscopy recall for March 2019 based on family history and current guidelines  A copy of this consultation note has been sent to Dr. Basilio Cairo

## 2014-08-20 NOTE — Patient Instructions (Signed)
You have been scheduled for an endoscopy. Please follow written instructions given to you at your visit today. If you use inhalers (even only as needed), please bring them with you on the day of your procedure.   You have a recall colonoscopy scheduled for 03/2017

## 2014-08-21 ENCOUNTER — Encounter: Payer: Medicare Other | Admitting: Internal Medicine

## 2014-08-23 ENCOUNTER — Encounter: Payer: Medicare Other | Admitting: Internal Medicine

## 2014-10-31 ENCOUNTER — Ambulatory Visit (AMBULATORY_SURGERY_CENTER): Payer: Medicare Other | Admitting: Internal Medicine

## 2014-10-31 ENCOUNTER — Encounter: Payer: Self-pay | Admitting: Internal Medicine

## 2014-10-31 VITALS — BP 142/71 | HR 50 | Temp 95.8°F | Resp 16 | Ht 70.0 in | Wt 188.0 lb

## 2014-10-31 DIAGNOSIS — R1310 Dysphagia, unspecified: Secondary | ICD-10-CM | POA: Diagnosis not present

## 2014-10-31 DIAGNOSIS — K2970 Gastritis, unspecified, without bleeding: Secondary | ICD-10-CM | POA: Diagnosis not present

## 2014-10-31 DIAGNOSIS — K2990 Gastroduodenitis, unspecified, without bleeding: Secondary | ICD-10-CM | POA: Diagnosis not present

## 2014-10-31 MED ORDER — SODIUM CHLORIDE 0.9 % IV SOLN: 500.0000 mL | INTRAVENOUS | Status: DC

## 2014-10-31 NOTE — Progress Notes (Signed)
Transferred to recovery room. A/O x3, pleased with MAC.  VSS.  Report to Annette, RN. 

## 2014-10-31 NOTE — Op Note (Signed)
[facility]  [address] [place], [id number]   ENDOSCOPY PROCEDURE REPORT  PATIENT: Adam Fleming, Adam Fleming  MR#: [id number] BIRTHDATE: [birth year] , 71  yrs. old GENDER: male ENDOSCOPIST: Eustace Quail, MD REFERRED BY:  Gwendolyn Grant, M.D. PROCEDURE DATE:  10/31/2014 PROCEDURE:  EGD w/ biopsy ASA CLASS:     Class II INDICATIONS:  dysphagia--ffood sticking in the pharyngeal region, cough. Chronic and Intermittent, but seemingly worsening. MEDICATIONS: Monitored anesthesia care and Propofol 140 mg IV TOPICAL ANESTHETIC: none  DESCRIPTION OF PROCEDURE: After the risks benefits and alternatives of the procedure were thoroughly explained, informed consent was obtained.  The LB [id number] endoscope was introduced through the mouth and advanced to the second portion of the duodenum , Without limitations.  The instrument was slowly withdrawn as the mucosa was fully examined.   EXAM:The esophagus was normal throughout.  Stomach revealed several pyloric erosions was otherwise normal.  CLO biopsy taken.  The duodenum was normal.  Retroflexed views revealed no abnormalities. The scope was then withdrawn from the patient and the procedure completed.  COMPLICATIONS: There were no immediate complications.  ENDOSCOPIC IMPRESSION: 1. Normal esophagus and posterior pharynx 2. Gastric antral erosions. CLO biopsy taken to rule out Helicobacter pylori 3. Otherwise normal EGD.  RECOMMENDATIONS: 1.  Rx CLO if positive 2.  My office will arrange for you to have a Modified Barium Swallow with Speech Pathology "rule out oropharyngeal dysphagia".  This is a radiology test to examine your swallowing mechanism  and coordination.  REPEAT EXAM:  eSigned:  Eustace Quail, MD 10/31/2014 3:15 PM    CC:The Patient and Rowe Clack, MD

## 2014-10-31 NOTE — Progress Notes (Signed)
No problems noted in the recovery room. maw 

## 2014-10-31 NOTE — Patient Instructions (Signed)
YOU HAD AN ENDOSCOPIC PROCEDURE TODAY AT [facility]:   Refer to the procedure report that was given to you for any specific questions about what was found during the examination.  If the procedure report does not answer your questions, please call your gastroenterologist to clarify.  If you requested that your care partner not be given the details of your procedure findings, then the procedure report has been included in a sealed envelope for you to review at your convenience later.  YOU SHOULD EXPECT: Some feelings of bloating in the abdomen. Passage of more gas than usual.  Walking can help get rid of the air that was put into your GI tract during the procedure and reduce the bloating. If you had a lower endoscopy (such as a colonoscopy or flexible sigmoidoscopy) you may notice spotting of blood in your stool or on the toilet paper. If you underwent a bowel prep for your procedure, you may not have a normal bowel movement for a few days.  Please Note:  You might notice some irritation and congestion in your nose or some drainage.  This is from the oxygen used during your procedure.  There is no need for concern and it should clear up in a day or so.  SYMPTOMS TO REPORT IMMEDIATELY:    Following upper endoscopy (EGD)  Vomiting of blood or coffee ground material  New chest pain or pain under the shoulder blades  Painful or persistently difficult swallowing  New shortness of breath  Fever of 100F or higher  Black, tarry-looking stools  For urgent or emergent issues, a gastroenterologist can be reached at any hour by calling [phone].   DIET: Your first meal following the procedure should be a small meal and then it is ok to progress to your normal diet. Heavy or fried foods are harder to digest and may make you feel nauseous or bloated.  Likewise, meals heavy in dairy and vegetables can increase bloating.  Drink plenty of fluids but you should avoid alcoholic beverages  for 24 hours.  ACTIVITY:  You should plan to take it easy for the rest of today and you should NOT DRIVE or use heavy machinery until tomorrow (because of the sedation medicines used during the test).    FOLLOW UP: Our staff will call the number listed on your records the next business day following your procedure to check on you and address any questions or concerns that you may have regarding the information given to you following your procedure. If we do not reach you, we will leave a message.  However, if you are feeling well and you are not experiencing any problems, there is no need to return our call.  We will assume that you have returned to your regular daily activities without incident.  If any biopsies were taken you will be contacted by phone or by letter within the next 1-3 weeks.  Please call us at [phone] if you have not heard about the biopsies in 3 weeks.    SIGNATURES/CONFIDENTIALITY: You and/or your care partner have signed paperwork which will be entered into your electronic medical record.  These signatures attest to the fact that that the information above on your After Visit Summary has been reviewed and is understood.  Full responsibility of the confidentiality of this discharge information lies with you and/or your care-partner.    Dr. Blanch Media office nurse will call you to schedule a Modified Barium Swallow with speech pathology.  This is a radiology test to examine your swallowing mechanism and coordination. You may resume your current medications today. Await biopsy results. Please call if any questions or concerns.

## 2014-10-31 NOTE — Progress Notes (Signed)
Flu shot approx 4 weeks ago and pt said he was sick from it 2 days, flu like symptoms weak and aching & upset stomach

## 2014-11-01 ENCOUNTER — Other Ambulatory Visit: Payer: Self-pay

## 2014-11-01 ENCOUNTER — Telehealth: Payer: Self-pay | Admitting: *Deleted

## 2014-11-01 DIAGNOSIS — R1310 Dysphagia, unspecified: Secondary | ICD-10-CM

## 2014-11-01 LAB — HELICOBACTER PYLORI SCREEN-BIOPSY: UREASE: NEGATIVE

## 2014-11-01 NOTE — Telephone Encounter (Signed)
  Follow up Call-  Call back number 10/31/2014  Post procedure Call Back phone  # [phone]  Permission to leave phone message Yes     Patient questions:  Do you have a fever, pain , or abdominal swelling? No. Pain Score  0 *  Have you tolerated food without any problems? Yes.    Have you been able to return to your normal activities? Yes.    Do you have any questions about your discharge instructions: Diet   No. Medications  No. Follow up visit  No.  Do you have questions or concerns about your Care? No.  Actions: * If pain score is 4 or above: No action needed, pain <4.

## 2014-11-04 ENCOUNTER — Telehealth: Payer: Self-pay

## 2014-11-04 NOTE — Telephone Encounter (Signed)
Pt scheduled for modified barium swallow with speech path at [facility] 11/18/14@1pm . Pt to arrive there at 12:45pm. Pt aware of appt.

## 2014-11-05 ENCOUNTER — Other Ambulatory Visit (HOSPITAL_COMMUNITY): Payer: Self-pay | Admitting: Internal Medicine

## 2014-11-05 DIAGNOSIS — R1310 Dysphagia, unspecified: Secondary | ICD-10-CM

## 2014-11-06 ENCOUNTER — Encounter: Payer: Self-pay | Admitting: Internal Medicine

## 2014-11-08 ENCOUNTER — Ambulatory Visit (HOSPITAL_COMMUNITY): Payer: Medicare Other

## 2014-11-18 ENCOUNTER — Ambulatory Visit (HOSPITAL_COMMUNITY)
Admission: RE | Admit: 2014-11-18 | Discharge: 2014-11-18 | Disposition: A | Discharge status: Home / Self Care | Payer: Medicare Other | Source: Ambulatory Visit | Attending: Internal Medicine | Admitting: Internal Medicine

## 2014-11-18 DIAGNOSIS — R1310 Dysphagia, unspecified: Secondary | ICD-10-CM

## 2014-11-18 NOTE — Procedures (Signed)
Objective Swallowing Evaluation: MBS-Modified Barium Swallow Study  Patient Details  Name: Yoandy Parr MRN: [id number] Date of Birth: [birth year]  Today's Date: 11/18/2014 Time: SLP Start Time (ACUTE ONLY): 1305-SLP Stop Time (ACUTE ONLY): 1335 SLP Time Calculation (min) (ACUTE ONLY): 30 min  Past Medical History:  Past Medical History  Diagnosis Date  . History of chicken pox   . Allergic rhinitis   . Bradycardia     asx, cards eval 06/2014  . Abnormal PSA     eval by uro 01/2014 - WNL, monitor  . Hyperlipidemia   . Internal hemorrhoids    Past Surgical History:  Past Surgical History  Procedure Laterality Date  . Tonsillectomy  1955   HPI: 69 yo male referred for OP MBS due to pt report of sensation of food "sticking" in his throat x 10 years with increased frequency as of late.  He reports when he senses food lodging, it causes him to cough - occuring with approximately 50% of meals.  Pt reports things like peanuts, crackers, etc would cause said symptoms.  He denies regurgitating food, requiring heimlich manuever, unintentional weight loss nor recurrent pnas.  Pt states liquids and pills are tolerated well.  Pt with negative neuro hx, has h/o abnormal PSA, ? allergic rhinitis, HLD, bradycardia, chicken pox. atypical pna approximately a few years ago.    He is s/p endoscopy and he reports it was negative.    Subjective: pt awake, sitting in flouro chair on his birthday!  Assessment / Plan / Recommendation  CHL IP CLINICAL IMPRESSIONS 11/18/2014  Therapy Diagnosis Mild pharyngeal phase dysphagia  Clinical Impression Mild pharyngeal dysphagia without aspiration of any consistency tested.  Pt does have decreased epiglottic deflection/minimal decreased tongue base retraction allowing mild pharyngeal residuals WITHOUT awareness.  In addition, trace laryngeal penetration of thin noted when pt took barium tablet.  Decreased timing of laryngeal closure and epiglotic deflection  likely allowed penetration.  Trace penetration cleared with further swallows as penetrates were squeezed out.  Barium tablet readily transited through oropharynx without delay.  Pt noted to clear his throat prior to, during and after MBS - and he did appear to have phlegm mixed with barium in pharynx  - reports to sense phlegm today. Using live video, educated pt to findings and reviewed strategies to hopefully prevent symptoms.  Advised pt to start meals with liquids due to xerostomia noted today.  Also following solids with liquids may faciliate clearance of pharynx and subsequently eliminate symptoms.  Thanks for this referral.        CHL IP TREATMENT RECOMMENDATION 11/18/2014  Treatment Recommendations No treatment recommended at this time     CHL IP DIET RECOMMENDATION 11/18/2014  SLP Diet Recommendations Regular solids;Thin liquid  Liquid Administration via Cup;Straw  Medication Administration Whole meds with liquid  Compensations Follow solids with liquid;Slow rate;Small sips/bites  Postural Changes Remain semi-upright after after feeds/meals (Comment);Seated upright at 90 degrees     CHL IP OTHER RECOMMENDATIONS 11/18/2014  Recommended Consults --  Oral Care Recommendations Oral care BID  Other Recommendations --     CHL IP FOLLOW UP RECOMMENDATIONS 11/18/2014  Follow up Recommendations None              CHL IP ORAL PHASE 11/18/2014  Oral Phase WFL  Oral - Pudding Teaspoon --  Oral - Pudding Cup --  Oral - Honey Teaspoon --  Oral - Honey Cup --  Oral - Nectar Teaspoon --  Oral - Nectar  Cup --  Oral - Nectar Straw --  Oral - Thin Teaspoon --  Oral - Thin Cup --  Oral - Thin Straw --  Oral - Puree --  Oral - Mech Soft --  Oral - Regular --  Oral - Multi-Consistency --  Oral - Pill --  Oral Phase - Comment --           11/18/14 1411  Pharyngeal Phase  Pharyngeal Phase Impaired  Pharyngeal - Nectar  Pharyngeal- Nectar Cup Reduced epiglottic inversion;Reduced  tongue base retraction;Pharyngeal residue - valleculae;Pharyngeal residue - pyriform  Pharyngeal - Thin  Pharyngeal- Thin Cup Reduced epiglottic inversion;Pharyngeal residue - pyriform;Pharyngeal residue - valleculae;Penetration/Aspiration during swallow  Pharyngeal Material enters airway, remains ABOVE vocal cords then ejected out  Pharyngeal - Solids  Pharyngeal- Pill WFL  Pharyngeal- Multi-consistency Reduced epiglottic inversion;Reduced airway/laryngeal closure;Pharyngeal residue - valleculae;Pharyngeal residue - pyriform (penetration of thin with pill only, mild residual of thin only - pill readily cleared)  Pharyngeal- Mechanical Soft Reduced epiglottic inversion;Pharyngeal residue - valleculae;Pharyngeal residue - pyriform  Pharyngeal- Puree Reduced epiglottic inversion;Pharyngeal residue - valleculae  Pharyngeal Phase - Comment  Pharyngeal Comment pt did NOT sense minimal amount of pharyngeal residuals  Electrical Stimulation - Pharyngeal Phase  Was Electrical Stimulation Used No                    Luanna Salk, [facility] SLP [phone]

## 2014-11-18 NOTE — Progress Notes (Signed)
   11/18/14 1404  SLP G-Codes **NOT FOR INPATIENT CLASS**  Functional Assessment Tool Used mbs, clinical judgement  Functional Limitations Swallowing  Swallow Current Status [id number]) CI  Swallow Goal Status [id number]) CI  Swallow Discharge Status [id number]) CI  SLP Evaluations  $ SLP Speech Visit 1 Procedure (1)  SLP Evaluations  $Swallowing Treatment 1 Procedure  $MBS Swallow Outpatient 1 Procedure  Luanna Salk, [facility] SLP [phone]

## 2014-11-25 ENCOUNTER — Encounter: Payer: Self-pay | Admitting: Physician Assistant

## 2014-11-25 ENCOUNTER — Ambulatory Visit (INDEPENDENT_AMBULATORY_CARE_PROVIDER_SITE_OTHER): Payer: Medicare Other | Admitting: Physician Assistant

## 2014-11-25 VITALS — BP 114/68 | HR 67 | Temp 97.9°F | Ht 70.5 in | Wt 190.0 lb

## 2014-11-25 DIAGNOSIS — J208 Acute bronchitis due to other specified organisms: Principal | ICD-10-CM

## 2014-11-25 DIAGNOSIS — J00 Acute nasopharyngitis [common cold]: Secondary | ICD-10-CM | POA: Diagnosis not present

## 2014-11-25 DIAGNOSIS — B9689 Other specified bacterial agents as the cause of diseases classified elsewhere: Secondary | ICD-10-CM

## 2014-11-25 MED ORDER — AZITHROMYCIN 250 MG PO TABS: ORAL_TABLET | ORAL | Status: DC

## 2014-11-25 NOTE — Assessment & Plan Note (Signed)
Rx Azithromycin.  Increase fluids.  Rest.  Saline nasal spray.  Probiotic.  Mucinex as directed.  Humidifier in bedroom.  Call or return to clinic if symptoms are not improving.  

## 2014-11-25 NOTE — Progress Notes (Signed)
Pre visit review using our clinic review tool, if applicable. No additional management support is needed unless otherwise documented below in the visit note. 

## 2014-11-25 NOTE — Patient Instructions (Signed)
Take antibiotic (Azithromycin) as directed.  Increase fluids.  Get plenty of rest. Use Mucinex -DM for congestion and cough. Take a daily probiotic (I recommend Align or Culturelle, but even Activia Yogurt may be beneficial).  A humidifier placed in the bedroom may offer some relief for a dry, scratchy throat of nasal irritation.  Read information below on acute bronchitis. Please call or return to clinic if symptoms are not improving.  Acute Bronchitis Bronchitis is when the airways that extend from the windpipe into the lungs get red, puffy, and painful (inflamed). Bronchitis often causes thick spit (mucus) to develop. This leads to a cough. A cough is the most common symptom of bronchitis. In acute bronchitis, the condition usually begins suddenly and goes away over time (usually in 2 weeks). Smoking, allergies, and asthma can make bronchitis worse. Repeated episodes of bronchitis may cause more lung problems.  HOME CARE  Rest.  Drink enough fluids to keep your pee (urine) clear or pale yellow (unless you need to limit fluids as told by your doctor).  Only take over-the-counter or prescription medicines as told by your doctor.  Avoid smoking and secondhand smoke. These can make bronchitis worse. If you are a smoker, think about using nicotine gum or skin patches. Quitting smoking will help your lungs heal faster.  Reduce the chance of getting bronchitis again by:  Washing your hands often.  Avoiding people with cold symptoms.  Trying not to touch your hands to your mouth, nose, or eyes.  Follow up with your doctor as told.  GET HELP IF: Your symptoms do not improve after 1 week of treatment. Symptoms include:  Cough.  Fever.  Coughing up thick spit.  Body aches.  Chest congestion.  Chills.  Shortness of breath.  Sore throat.  GET HELP RIGHT AWAY IF:   You have an increased fever.  You have chills.  You have severe shortness of breath.  You have bloody thick  spit (sputum).  You throw up (vomit) often.  You lose too much body fluid (dehydration).  You have a severe headache.  You faint.  MAKE SURE YOU:   Understand these instructions.  Will watch your condition.  Will get help right away if you are not doing well or get worse. Document Released: 06/09/2007 Document Revised: 08/23/2012 Document Reviewed: 06/13/2012 [facility] Patient Information 2015 [place], [place]. This information is not intended to replace advice given to you by your health care provider. Make sure you discuss any questions you have with your health care provider.

## 2014-11-25 NOTE — Progress Notes (Signed)
    Patient presents to clinic today c/o 8 days of chest congestion, productive cough with fatigue. Denies fever, chills or SOB. Denies recent travel or sick contact. Has taken Mucinex with mild relief in symptoms.  Past Medical History  Diagnosis Date  . History of chicken pox   . Allergic rhinitis   . Bradycardia     asx, cards eval 06/2014  . Abnormal PSA     eval by uro 01/2014 - WNL, monitor  . Hyperlipidemia   . Internal hemorrhoids     Current Outpatient Prescriptions on File Prior to Visit  Medication Sig Dispense Refill  . Multiple Vitamins-Minerals (MENS MULTI VITAMIN & MINERAL) TABS Take by mouth daily.     No current facility-administered medications on file prior to visit.    No Known Allergies  Family History  Problem Relation Age of Onset  . Colon cancer Mother 16  . Heart disease Father 21    Aortic valve replacement  . Colon cancer Maternal Aunt 30  . Colon cancer Maternal Grandmother   . Colonic polyp Father     Social History   Social History  . Marital Status: Married    Spouse Name: N/A  . Number of Children: 0  . Years of Education: N/A   Occupational History  . Retired    Social History Main Topics  . Smoking status: Never Smoker   . Smokeless tobacco: Never Used  . Alcohol Use: 0.0 oz/week    0 Standard drinks or equivalent per week     Comment: Occ  . Drug Use: No  . Sexual Activity: Not Asked   Other Topics Concern  . None   Social History Narrative   Review of Systems - See HPI.  All other ROS are negative.  BP 114/68 mmHg  Pulse 67  Temp(Src) 97.9 F (36.6 C) (Oral)  Ht 5' 10.5" (1.791 m)  Wt 190 lb (86.183 kg)  BMI 26.87 kg/m2  SpO2 98%  Physical Exam  Constitutional: He is oriented to person, place, and time and well-developed, well-nourished, and in no distress.  HENT:  Head: Normocephalic and atraumatic.  Right Ear: External ear normal.  Left Ear: External ear normal.  Nose: Nose normal.  Mouth/Throat:  Oropharynx is clear and moist. No oropharyngeal exudate.  TM within normal limits bilaterally.  Eyes: Conjunctivae are normal.  Neck: Neck supple.  Cardiovascular: Normal rate, regular rhythm, normal heart sounds and intact distal pulses.   Pulmonary/Chest: Effort normal and breath sounds normal. No respiratory distress. He has no wheezes. He has no rales. He exhibits no tenderness.  Neurological: He is alert and oriented to person, place, and time.  Skin: Skin is warm and dry. No rash noted.  Psychiatric: Affect normal.  Vitals reviewed.  Recent Results (from the past 0000000 hour(s))  Helicobacter pylori screen-biopsy     Status: None   Collection Time: 10/31/14  3:00 PM  Result Value Ref Range   UREASE Negative Negative    Assessment/Plan: Acute bacterial bronchitis Rx Azithromycin.  Increase fluids.  Rest.  Saline nasal spray.  Probiotic.  Mucinex as directed.  Humidifier in bedroom.  Call or return to clinic if symptoms are not improving.

## 2015-04-01 DIAGNOSIS — M5136 Other intervertebral disc degeneration, lumbar region: Secondary | ICD-10-CM | POA: Insufficient documentation

## 2015-04-01 DIAGNOSIS — H40002 Preglaucoma, unspecified, left eye: Secondary | ICD-10-CM | POA: Insufficient documentation

## 2015-07-25 DIAGNOSIS — Z0000 Encounter for general adult medical examination without abnormal findings: Secondary | ICD-10-CM | POA: Insufficient documentation

## 2015-08-30 ENCOUNTER — Emergency Department (HOSPITAL_BASED_OUTPATIENT_CLINIC_OR_DEPARTMENT_OTHER)
Admission: EM | Admit: 2015-08-30 | Discharge: 2015-08-30 | Disposition: A | Discharge status: Home / Self Care | Payer: Medicare Other | Attending: Emergency Medicine | Admitting: Emergency Medicine

## 2015-08-30 ENCOUNTER — Encounter (HOSPITAL_BASED_OUTPATIENT_CLINIC_OR_DEPARTMENT_OTHER): Payer: Self-pay | Admitting: *Deleted

## 2015-08-30 DIAGNOSIS — M5441 Lumbago with sciatica, right side: Secondary | ICD-10-CM

## 2015-08-30 DIAGNOSIS — Z79899 Other long term (current) drug therapy: Secondary | ICD-10-CM | POA: Insufficient documentation

## 2015-08-30 DIAGNOSIS — M545 Low back pain: Secondary | ICD-10-CM | POA: Diagnosis present

## 2015-08-30 DIAGNOSIS — R0981 Nasal congestion: Secondary | ICD-10-CM | POA: Diagnosis not present

## 2015-08-30 DIAGNOSIS — M5442 Lumbago with sciatica, left side: Secondary | ICD-10-CM

## 2015-08-30 DIAGNOSIS — M5440 Lumbago with sciatica, unspecified side: Secondary | ICD-10-CM | POA: Diagnosis not present

## 2015-08-30 HISTORY — DX: Unspecified glaucoma: H40.9

## 2015-08-30 HISTORY — DX: Sciatica, unspecified side: M54.30

## 2015-08-30 HISTORY — DX: Personal history of other diseases of the musculoskeletal system and connective tissue: Z87.39

## 2015-08-30 MED ORDER — CYCLOBENZAPRINE HCL 5 MG PO TABS: 5.0000 mg | ORAL_TABLET | Freq: Three times a day (TID) | ORAL | 0 refills | Status: DC | PRN

## 2015-08-30 MED ORDER — KETOROLAC TROMETHAMINE 30 MG/ML IJ SOLN
30.0000 mg | Freq: Once | INTRAMUSCULAR | Status: AC
  Administered 2015-08-30: 30 mg via INTRAVENOUS
  Filled 2015-08-30: qty 1

## 2015-08-30 NOTE — ED Provider Notes (Signed)
[place] DEPT [facility] Provider Note   CSN: [id number] Arrival date & time: 08/30/15  0700 History   Chief Complaint Chief Complaint  Patient presents with  . Back Pain    HPI Adam Fleming is a 70 y.o. male.  HPI  Patient presenting with bilateral low back pain.   Patient reports history of low back pain, with recently increased pain over the past week. Pain worsened last night, so he decided to come to ED this morning. Denies any trauma or change in usual activity preceding pain. Has taken ibuprofen 400mg  intermittently throughout the week with no relief. Ice packs and heating pads have helped some, though do not relieve his symptoms for long. Denies numbness, weakness, tingling, bowel/bladder incontinence. Occasionally experiences a sharp pain to his knee at night.   Past Medical History:  Diagnosis Date  . Abnormal PSA    eval by uro 01/2014 - WNL, monitor  . Allergic rhinitis   . Bradycardia    asx, cards eval 06/2014  . Glaucoma   . History of chicken pox   . History of degenerative disc disease   . Hyperlipidemia   . Internal hemorrhoids   . Sciatica     Patient Active Problem List   Diagnosis Date Noted  . Acute bacterial bronchitis 11/25/2014  . Hyperlipidemia, mild   . Abnormal PSA   . Pre-syncope 06/18/2014  . 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 Arthralgia 12/08/2012  . Dysphagia 12/08/2012  . Memory loss of unknown cause 12/08/2012  . Allergic rhinitis 07/31/2012    Past Surgical History:  Procedure Laterality Date  . TONSILLECTOMY  1955    Home Medications    Prior to Admission medications   Medication Sig Start Date End Date Taking? Authorizing Provider  Multiple Vitamins-Minerals (MENS MULTI VITAMIN & MINERAL) TABS Take by mouth daily.   Yes Historical Provider, MD  cyclobenzaprine (FLEXERIL) 5 MG tablet Take 1 tablet (5 mg total) by mouth 3 (three) times daily as needed for muscle spasms. 08/30/15   Verner Mould, MD    Family  History Family History  Problem Relation Age of Onset  . Colon cancer Mother 75  . Heart disease Father 39    Aortic valve replacement  . Colonic polyp Father   . Colon cancer Maternal Aunt 37  . Colon cancer Maternal Grandmother     Social History Social History  Substance Use Topics  . Smoking status: Never Smoker  . Smokeless tobacco: Never Used  . Alcohol use 0.0 oz/week     Comment: several times weekly     Allergies   Review of patient's allergies indicates no known allergies.   Review of Systems Review of Systems  Constitutional: Negative for activity change.  HENT: Positive for congestion.   Respiratory: Negative for shortness of breath.   Cardiovascular: Negative for chest pain.  Gastrointestinal: Negative for abdominal pain.  Musculoskeletal: Positive for back pain. Negative for gait problem and neck pain.  Allergic/Immunologic: Negative for immunocompromised state.  Neurological: Negative for weakness and numbness.   Physical Exam Updated Vital Signs BP 121/76 (BP Location: Right Arm)   Pulse (!) 56   Temp 98.3 F (36.8 C) (Oral)   Resp 20   Ht 6' (1.829 m)   Wt 83 kg   SpO2 100%   BMI 24.82 kg/m   Physical Exam  Constitutional: He is oriented to person, place, and time. He appears well-developed and well-nourished. No distress.  HENT:  Head: Normocephalic and atraumatic.  Nose:  Nose normal.  Eyes: EOM are normal. Right eye exhibits no discharge. Left eye exhibits no discharge.  Neck: Normal range of motion.  Cardiovascular: Normal rate, regular rhythm, normal heart sounds and intact distal pulses.   No murmur heard. Pulmonary/Chest: Effort normal and breath sounds normal. No respiratory distress. He has no wheezes.  Abdominal: Soft. Bowel sounds are normal. He exhibits no distension. There is no tenderness.  Musculoskeletal:  No TTP of back in lumbar region or elsewhere. No midline tenderness or bony spinal abnormalities. 5/5 strength lower  extremities bilaterally. Able to ambulate unassisted but slowly 2/2 pain.  Neurological: He is alert and oriented to person, place, and time. No cranial nerve deficit.  Skin: Skin is warm and dry. He is not diaphoretic.  Psychiatric: He has a normal mood and affect. His behavior is normal.   ED Treatments / Results  Labs (all labs ordered are listed, but only abnormal results are displayed) Labs Reviewed - No data to display  EKG  EKG Interpretation None       Radiology No results found.  Procedures Procedures (including critical care time)  Medications Ordered in ED Medications  ketorolac (TORADOL) 30 MG/ML injection 30 mg (not administered)    Initial Impression / Assessment and Plan / ED Course  I have reviewed the triage vital signs and the nursing notes.  Pertinent labs & imaging results that were available during my care of the patient were reviewed by me and considered in my medical decision making (see chart for details).  Clinical Course   Final Clinical Impressions(s) / ED Diagnoses   Final diagnoses:  Bilateral low back pain with sciatica, sciatica laterality unspecified   Patient presenting with back pain. Likely MSK etiology, with no signs of neurological deficits and no red flags. Received IM Toradol x1 in ED. Ibuprofen 800mg  q6 or naproxen BID (patient's preference) with Flexeril PRN. Can also use ice packs and heating pads. Encouraged remaining active and following up with PCP if pain not improved in a few days.   New Prescriptions New Prescriptions   CYCLOBENZAPRINE (FLEXERIL) 5 MG TABLET    Take 1 tablet (5 mg total) by mouth 3 (three) times daily as needed for muscle spasms.     Verner Mould, MD 08/30/15 [id number]    Blanchie Dessert, MD 08/30/15 [phone]

## 2015-08-30 NOTE — ED Notes (Signed)
MD at bedside. 

## 2015-08-30 NOTE — ED Triage Notes (Signed)
Pt reports low back pain (R>L) over the past week that worsened last night. Pt reports degenerative disc disease. Denies fever,n/v/d, incontinence. Reports radiation down bil legs to knee area. States he took 400 mg Motrin yesterday with no effect.

## 2015-08-30 NOTE — Discharge Instructions (Signed)
For your back pain, please begin taking NSAIDs (anti-inflammatory medications) for about the next five days whether you are in pain or not. You can take ibuprofen 800 mg every 6 hours, or naproxen every 12 hours, whether you are in pain or not. You can also take Flexeril up to three times a day for pain or muscle spasms. This medication may make you sleepy. You can continue use ice packs or heating pads as needed if this helps your pain.  It is important to stay mobile, however do not overexert yourself.   If your pain worsens, or has not improved by Monday or Tuesday, please follow up with your regular doctor.  If you develop numbness or weakness in your legs, or are unable to control your bladder or bowels, please return to the emergency room.

## 2015-09-04 DIAGNOSIS — J069 Acute upper respiratory infection, unspecified: Secondary | ICD-10-CM | POA: Insufficient documentation

## 2015-09-09 ENCOUNTER — Other Ambulatory Visit: Payer: Self-pay

## 2015-09-25 DIAGNOSIS — L247 Irritant contact dermatitis due to plants, except food: Secondary | ICD-10-CM | POA: Insufficient documentation

## 2016-10-06 DIAGNOSIS — Z23 Encounter for immunization: Secondary | ICD-10-CM | POA: Insufficient documentation

## 2016-10-06 DIAGNOSIS — Z0000 Encounter for general adult medical examination without abnormal findings: Secondary | ICD-10-CM | POA: Insufficient documentation

## 2017-03-01 ENCOUNTER — Encounter: Payer: Self-pay | Admitting: Internal Medicine

## 2017-05-02 ENCOUNTER — Ambulatory Visit (AMBULATORY_SURGERY_CENTER): Payer: Self-pay | Admitting: *Deleted

## 2017-05-02 ENCOUNTER — Other Ambulatory Visit: Payer: Self-pay

## 2017-05-02 VITALS — Ht 72.0 in | Wt 196.2 lb

## 2017-05-02 DIAGNOSIS — Z800 Family history of malignant neoplasm of digestive organs: Secondary | ICD-10-CM

## 2017-05-02 MED ORDER — PEG-KCL-NACL-NASULF-NA ASC-C 140 G PO SOLR: 1.0000 | ORAL | 0 refills | Status: DC

## 2017-05-02 NOTE — Progress Notes (Signed)
No egg or soy allergy known to patient  No issues with past sedation with any surgeries  or procedures, no intubation problems  No diet pills per patient No home 02 use per patient  No blood thinners per patient  Pt denies issues with constipation  No A fib or A flutter  EMMI video sent to pt's e mail -pt declined  plenvu sample as insurance will not cover prep-  Lot [id number]  Exp 06-2018 as directed

## 2017-05-24 ENCOUNTER — Ambulatory Visit (AMBULATORY_SURGERY_CENTER): Payer: Medicare Other | Admitting: Internal Medicine

## 2017-05-24 ENCOUNTER — Other Ambulatory Visit: Payer: Self-pay

## 2017-05-24 ENCOUNTER — Encounter: Payer: Self-pay | Admitting: Internal Medicine

## 2017-05-24 VITALS — BP 104/65 | HR 47 | Temp 99.1°F | Resp 12 | Ht 72.0 in | Wt 196.0 lb

## 2017-05-24 DIAGNOSIS — Z1211 Encounter for screening for malignant neoplasm of colon: Secondary | ICD-10-CM

## 2017-05-24 DIAGNOSIS — D122 Benign neoplasm of ascending colon: Secondary | ICD-10-CM

## 2017-05-24 DIAGNOSIS — Z800 Family history of malignant neoplasm of digestive organs: Secondary | ICD-10-CM | POA: Diagnosis not present

## 2017-05-24 DIAGNOSIS — D123 Benign neoplasm of transverse colon: Secondary | ICD-10-CM | POA: Diagnosis not present

## 2017-05-24 DIAGNOSIS — D124 Benign neoplasm of descending colon: Secondary | ICD-10-CM | POA: Diagnosis not present

## 2017-05-24 MED ORDER — SODIUM CHLORIDE 0.9 % IV SOLN: 500.0000 mL | Freq: Once | INTRAVENOUS | Status: DC

## 2017-05-24 NOTE — Progress Notes (Signed)
Report to PACU, RN, vss, BBS= Clear.  

## 2017-05-24 NOTE — Op Note (Signed)
[place] Patient Name: Adam Fleming Procedure Date: 05/24/2017 8:01 AM MRN: [id number] Endoscopist: Docia Chuck. Henrene Pastor , MD Age: 72 Referring MD:  Date of Birth: 03-22-1945 Gender: Male Account #: [id number] Procedure:                Colonoscopy, with cold snare polypectomy x 2 Indications:              Screening in patient at increased risk: Colorectal                            cancer in mother before age 68. Previous negative                            examinations in [place] and 2014 Medicines:                Monitored Anesthesia Care Procedure:                Pre-Anesthesia Assessment:                           - Prior to the procedure, a History and Physical                            was performed, and patient medications and                            allergies were reviewed. The patient's tolerance of                            previous anesthesia was also reviewed. The risks                            and benefits of the procedure and the sedation                            options and risks were discussed with the patient.                            All questions were answered, and informed consent                            was obtained. Prior Anticoagulants: The patient has                            taken no previous anticoagulant or antiplatelet                            agents. ASA Grade Assessment: I - A normal, healthy                            patient. After reviewing the risks and benefits,                            the patient was deemed in satisfactory condition to  undergo the procedure.                           After obtaining informed consent, the colonoscope                            was passed under direct vision. Throughout the                            procedure, the patient's blood pressure, pulse, and                            oxygen saturations were monitored continuously. The    Model CF-HQ190L [id number]) scope was introduced                            through the anus and advanced to the the cecum,                            identified by appendiceal orifice and ileocecal                            valve. The ileocecal valve, appendiceal orifice,                            and rectum were photographed. The quality of the                            bowel preparation was excellent. The colonoscopy                            was performed without difficulty. The patient                            tolerated the procedure well. The bowel preparation                            used was SUPREP. Scope In: 8:10:11 AM Scope Out: 8:29:31 AM Scope Withdrawal Time: 0 hours 15 minutes 51 seconds  Total Procedure Duration: 0 hours 19 minutes 20 seconds  Findings:                 Two polyps were found in the descending colon and                            ascending colon. The polyps were 1 to 4 mm in size.                            These polyps were removed with a cold snare.                            Resection and retrieval were complete.                           Internal hemorrhoids were  found during                            retroflexion. The hemorrhoids were small.                           The exam was otherwise without abnormality on                            direct and retroflexion views. Complications:            No immediate complications. Estimated blood loss:                            None. Estimated Blood Loss:     Estimated blood loss: none. Impression:               - Two 1 to 4 mm polyps in the descending colon and                            in the ascending colon, removed with a cold snare.                            Resected and retrieved.                           - Internal hemorrhoids.                           - The examination was otherwise normal on direct                            and retroflexion views. Recommendation:           - Repeat  colonoscopy in 5 years for surveillance.                           - Patient has a contact number available for                            emergencies. The signs and symptoms of potential                            delayed complications were discussed with the                            patient. Return to normal activities tomorrow.                            Written discharge instructions were provided to the                            patient.                           - Resume previous diet.                           -  Continue present medications.                           - Await pathology results. Docia Chuck. Henrene Pastor, MD 05/24/2017 8:41:50 AM This report has been signed electronically.

## 2017-05-24 NOTE — Patient Instructions (Signed)
YOU HAD AN ENDOSCOPIC PROCEDURE TODAY AT THE [facility]:   Refer to the procedure report that was given to you for any specific questions about what was found during the examination.  If the procedure report does not answer your questions, please call your gastroenterologist to clarify.  If you requested that your care partner not be given the details of your procedure findings, then the procedure report has been included in a sealed envelope for you to review at your convenience later.  YOU SHOULD EXPECT: Some feelings of bloating in the abdomen. Passage of more gas than usual.  Walking can help get rid of the air that was put into your GI tract during the procedure and reduce the bloating. If you had a lower endoscopy (such as a colonoscopy or flexible sigmoidoscopy) you may notice spotting of blood in your stool or on the toilet paper. If you underwent a bowel prep for your procedure, you may not have a normal bowel movement for a few days.  Please Note:  You might notice some irritation and congestion in your nose or some drainage.  This is from the oxygen used during your procedure.  There is no need for concern and it should clear up in a day or so.  SYMPTOMS TO REPORT IMMEDIATELY:   Following lower endoscopy (colonoscopy or flexible sigmoidoscopy):  Excessive amounts of blood in the stool  Significant tenderness or worsening of abdominal pains  Swelling of the abdomen that is new, acute  Fever of 100F or higher  Please see handouts given to you on Polyps and Hemorrhoids.  For urgent or emergent issues, a gastroenterologist can be reached at any hour by calling [phone].   DIET:  We do recommend a small meal at first, but then you may proceed to your regular diet.  Drink plenty of fluids but you should avoid alcoholic beverages for 24 hours.  ACTIVITY:  You should plan to take it easy for the rest of today and you should NOT DRIVE or use heavy machinery until tomorrow  (because of the sedation medicines used during the test).    FOLLOW UP: Our staff will call the number listed on your records the next business day following your procedure to check on you and address any questions or concerns that you may have regarding the information given to you following your procedure. If we do not reach you, we will leave a message.  However, if you are feeling well and you are not experiencing any problems, there is no need to return our call.  We will assume that you have returned to your regular daily activities without incident.  If any biopsies were taken you will be contacted by phone or by letter within the next 1-3 weeks.  Please call us at [phone] if you have not heard about the biopsies in 3 weeks.    SIGNATURES/CONFIDENTIALITY: You and/or your care partner have signed paperwork which will be entered into your electronic medical record.  These signatures attest to the fact that that the information above on your After Visit Summary has been reviewed and is understood.  Full responsibility of the confidentiality of this discharge information lies with you and/or your care-partner.  Thank you for letting us take care of your healthcare needs today. 

## 2017-05-24 NOTE — Progress Notes (Signed)
Pt's states no medical or surgical changes since previsit or office visit. 

## 2017-05-24 NOTE — Progress Notes (Signed)
Called to room to assist during endoscopic procedure.  Patient ID and intended procedure confirmed with present staff. Received instructions for my participation in the procedure from the performing physician.  

## 2017-05-25 ENCOUNTER — Telehealth: Payer: Self-pay | Admitting: *Deleted

## 2017-05-25 NOTE — Telephone Encounter (Signed)
  Follow up Call-  Call back number 05/24/2017 10/31/2014  Post procedure Call Back phone  # [phone]  Permission to leave phone message Yes Yes  Some recent data might be hidden     Patient questions:  Do you have a fever, pain , or abdominal swelling? No. Pain Score  0 *  Have you tolerated food without any problems? Yes.    Have you been able to return to your normal activities? Yes.    Do you have any questions about your discharge instructions: Diet   No. Medications  No. Follow up visit  No.  Do you have questions or concerns about your Care? No.  Actions: * If pain score is 4 or above: No action needed, pain <4.

## 2017-05-31 ENCOUNTER — Encounter: Payer: Self-pay | Admitting: Internal Medicine

## 2019-02-01 ENCOUNTER — Ambulatory Visit: Payer: Medicare Other

## 2019-02-10 ENCOUNTER — Ambulatory Visit: Payer: Medicare Other | Attending: Internal Medicine

## 2019-02-10 DIAGNOSIS — Z23 Encounter for immunization: Secondary | ICD-10-CM | POA: Insufficient documentation

## 2019-02-10 NOTE — Progress Notes (Signed)
   Covid-19 Vaccination Clinic  Name:  Aimon Bernick    MRN: [id number] DOB: 1945/01/18  02/10/2019  Mr. Coch was observed post Covid-19 immunization for 15 minutes without incidence. He was provided with Vaccine Information Sheet and instruction to access the V-Safe system.   Mr. Calliham was instructed to call 911 with any severe reactions post vaccine: Marland Kitchen Difficulty breathing  . Swelling of your face and throat  . A fast heartbeat  . A bad rash all over your body  . Dizziness and weakness    Immunizations Administered    Name Date Dose VIS Date Route   Pfizer COVID-19 Vaccine 02/10/2019 11:06 AM 0.3 mL 12/15/2018 Intramuscular   Manufacturer: [employer]   Lot: [id number]   [place]: [id number]

## 2019-02-22 ENCOUNTER — Ambulatory Visit: Payer: Medicare Other

## 2019-03-07 ENCOUNTER — Ambulatory Visit: Payer: Medicare Other | Attending: Internal Medicine

## 2019-03-07 DIAGNOSIS — Z23 Encounter for immunization: Secondary | ICD-10-CM | POA: Insufficient documentation

## 2019-03-07 NOTE — Progress Notes (Signed)
   Covid-19 Vaccination Clinic  Name:  Braxtan Lacap    MRN: [id number] DOB: [birth year]  03/07/2019  Mr. Pollan was observed post Covid-19 immunization for 15 minutes without incident. He was provided with Vaccine Information Sheet and instruction to access the V-Safe system.   Mr. Goudy was instructed to call 911 with any severe reactions post vaccine: Marland Kitchen Difficulty breathing  . Swelling of face and throat  . A fast heartbeat  . A bad rash all over body  . Dizziness and weakness   Immunizations Administered    Name Date Dose VIS Date Route   Pfizer COVID-19 Vaccine 03/07/2019 11:09 AM 0.3 mL 12/15/2018 Intramuscular   Manufacturer: [place]   Lot: [id number]   [place]: [id number]

## 2019-12-04 DIAGNOSIS — R351 Nocturia: Secondary | ICD-10-CM | POA: Insufficient documentation

## 2019-12-12 ENCOUNTER — Encounter: Payer: Self-pay | Admitting: Physical Therapy

## 2019-12-12 ENCOUNTER — Other Ambulatory Visit: Payer: Self-pay

## 2019-12-12 ENCOUNTER — Ambulatory Visit: Payer: Medicare Other | Attending: Family Medicine | Admitting: Physical Therapy

## 2019-12-12 DIAGNOSIS — R2689 Other abnormalities of gait and mobility: Secondary | ICD-10-CM | POA: Diagnosis not present

## 2019-12-12 NOTE — Therapy (Signed)
[place] [place], [place], [address] Phone: [phone]   Fax:  [phone]  Physical Therapy Evaluation  Patient Details  Name: Adam Fleming MRN: [id number] Date of Birth: 10/16/1945 Referring Provider (PT): Bouska   Encounter Date: 12/12/2019   PT End of Session - 12/12/19 1012    Visit Number 1    Date for PT Re-Evaluation 02/12/20    PT Start Time 0930    PT Stop Time 1005    PT Time Calculation (min) 35 min    Activity Tolerance Patient tolerated treatment well    Behavior During Therapy [facility] for tasks assessed/performed           Past Medical History:  Diagnosis Date  . Abnormal PSA    eval by uro 01/2014 - WNL, monitor  . Allergic rhinitis   . Allergy    seasonal  . Bradycardia    asx, cards eval 06/2014- Hr always in the 50's per pt.  . Glaucoma   . History of chicken pox   . History of degenerative disc disease   . Hyperlipidemia    past history borderline high many years ago - no meds   . Internal hemorrhoids   . Sciatica     Past Surgical History:  Procedure Laterality Date  . COLONOSCOPY     [id number]  . TONSILLECTOMY  1955    There were no vitals filed for this visit.    Subjective Assessment - 12/12/19 0934    Subjective Pt reports mild feelings of imbalance with gait worsening over the past year. Pt unsure of need for PT but would like to get a baseline for his balance. Reports occasional feelings of dizziness with getting up but no room spinning sensations or consistent issues. Pt denies changes in vision or new HA.    Currently in Pain? No/denies   reports intermittent LBP             [facility] PT Assessment - 12/12/19 0001      Assessment   Medical Diagnosis imbalance with gait    Referring Provider (PT) Bouska    Prior Therapy PT for LBP      Precautions   Precautions None      Restrictions   Weight Bearing Restrictions No      Balance Screen   Has the  patient fallen in the past 6 months No    Has the patient had a decrease in activity level because of a fear of falling?  No    Is the patient reluctant to leave their home because of a fear of falling?  No      Home Environment   Additional Comments reports stairs into home; reports no trouble with stairs      Prior Function   Level of Independence Independent    Vocation Retired    Leisure walking, music, reading      Functional Tests   Functional tests Sit to Stand      Sit to Stand   Comments [facility]      Posture/Postural Control   Posture/Postural Control Postural limitations    Postural Limitations Rounded Shoulders;Forward head      ROM / Strength   AROM / PROM / Strength AROM;Strength      AROM   Overall AROM Comments LE ROM WFL, lumbar ROM WFL      Strength   Overall Strength Comments 5/5 BLE      Transfers  Five time sit to stand comments  WFL; < 15 sec with no UE support      Ambulation/Gait   Gait Comments [facility]      Standardized Balance Assessment   Standardized Balance Assessment Berg Balance Test;Timed Up and Go Test;Dynamic Gait Index      Berg Balance Test   Sit to Stand Able to stand without using hands and stabilize independently    Standing Unsupported Able to stand safely 2 minutes    Sitting with Back Unsupported but Feet Supported on Floor or Stool Able to sit safely and securely 2 minutes    Stand to Sit Sits safely with minimal use of hands    Transfers Able to transfer safely, minor use of hands    Standing Unsupported with Eyes Closed Able to stand 10 seconds safely    Standing Unsupported with Feet Together Able to place feet together independently and stand 1 minute safely    From Standing, Reach Forward with Outstretched Arm Can reach forward >12 cm safely (5")    From Standing Position, Pick up Object from Floor Able to pick up shoe safely and easily    From Standing Position, Turn to Look Behind Over each Shoulder Looks behind from both  sides and weight shifts well    Turn 360 Degrees Able to turn 360 degrees safely in 4 seconds or less    Standing Unsupported, Alternately Place Feet on Step/Stool Able to stand independently and complete 8 steps >20 seconds    Standing Unsupported, One Foot in Front Able to plae foot ahead of the other independently and hold 30 seconds    Standing on One Leg Able to lift leg independently and hold 5-10 seconds    Total Score 52      Dynamic Gait Index   Level Surface Normal    Change in Gait Speed Normal    Gait with Horizontal Head Turns Normal    Gait with Vertical Head Turns Normal    Gait and Pivot Turn Normal    Step Over Obstacle Normal    Step Around Obstacles Normal    Steps Normal    Total Score 24      Timed Up and Go Test   Manual TUG (seconds) 10.3    TUG Comments no AD                      Objective measurements completed on examination: See above findings.       [facility] Adult PT Treatment/Exercise - 12/12/19 0001      High Level Balance   High Level Balance Activities Backward walking;Direction changes;Marching forwards    High Level Balance Comments HEP for balance prescribed                  PT Education - 12/12/19 1012    Education Details Pt educated on POC and HEP    Person(s) Educated Patient    Methods Explanation;Demonstration;Handout    Comprehension Verbalized understanding;Returned demonstration            PT Short Term Goals - 12/12/19 1018      PT SHORT TERM GOAL #1   Title Pt will be I with initial HEP    Time 2    Period Weeks    Status New    Target Date 12/26/19             PT Long Term Goals - 12/12/19 1018  PT LONG TERM GOAL #1   Title Pt will be I with advanced HEP    Time 4    Period Weeks    Status New    Target Date 01/09/20      PT LONG TERM GOAL #2   Title Pt will demo Berg Balance score 54/56    Baseline 52/56    Time 4    Period Weeks    Status New    Target Date 01/09/20                   Plan - 12/12/19 1013    Clinical Impression Statement Pt presents to clinic with reports of intermittent feelings of imbalance with gait, no falls. Pt demos LE strength and AROM WFL, WFL 5x STS and TUG, and 24/24 on DGI. Pt score of 52/56 on Berg with the most difficulty with SLS activites. Prescribed HEP to address mild balance deficits. Pt will be doing some traveling over the holidays; discussed with pt initiation of HEP independently and pt will call for follow up if needed after holidays. Upon pt return, plan for progression of HEP and high level balance ex's.    Personal Factors and Comorbidities Age    Examination-Activity Limitations Locomotion Level    Examination-Participation Restrictions Community Activity    Stability/Clinical Decision Making Stable/Uncomplicated    Clinical Decision Making Low    Rehab Potential Good    PT Frequency 1x / week    PT Duration 4 weeks    PT Treatment/Interventions ADLs/Self Care Home Management;Gait training;Functional mobility training;Therapeutic activities;Therapeutic exercise;Balance training;Neuromuscular re-education    PT Next Visit Plan progress HEP, high level balance activities    PT Home Exercise Plan tandem stance, tandem walking, SLS, marching fowards, backwards walking    Consulted and Agree with Plan of Care Patient           Patient will benefit from skilled therapeutic intervention in order to improve the following deficits and impairments:  Decreased balance  Visit Diagnosis: Balance problem  Other abnormalities of gait and mobility     Problem List Patient Active Problem List   Diagnosis Date Noted  . Acute bacterial bronchitis 11/25/2014  . Hyperlipidemia, mild   . Abnormal PSA   . Pre-syncope 06/18/2014  . 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 Arthralgia 12/08/2012  . Dysphagia 12/08/2012  . Memory loss of unknown cause 12/08/2012  . Allergic rhinitis 07/31/2012   Amador Cunas, PT,  DPT Donald Prose Kathern Lobosco 12/12/2019, 10:21 AM  [place]. [place], [place], [address] Phone: [phone]   Fax:  [phone]  Name: Adam Fleming MRN: [id number] Date of Birth: June 10, 1945

## 2019-12-12 NOTE — Patient Instructions (Signed)
Access Code: [email or web address] URL: [email or web address] Date: 12/12/2019 Prepared by: Amador Cunas  Exercises Standing Tandem Balance with Counter Support - 1 x daily - 7 x weekly - 3 sets - 10 reps Tandem Walking with Counter Support - 1 x daily - 7 x weekly - 3 sets - 10 reps Backward Walking with Counter Support - 1 x daily - 7 x weekly - 3 sets - 10 reps Single Leg Stance with Support - 1 x daily - 7 x weekly - 3 sets - 10 reps Standing Marching - 1 x daily - 7 x weekly - 3 sets - 10 reps

## 2021-03-14 ENCOUNTER — Emergency Department (HOSPITAL_BASED_OUTPATIENT_CLINIC_OR_DEPARTMENT_OTHER): Payer: Medicare HMO

## 2021-03-14 ENCOUNTER — Emergency Department (HOSPITAL_BASED_OUTPATIENT_CLINIC_OR_DEPARTMENT_OTHER)
Admission: EM | Admit: 2021-03-14 | Discharge: 2021-03-14 | Disposition: A | Discharge status: Home / Self Care | Payer: Medicare HMO | Attending: Emergency Medicine | Admitting: Emergency Medicine

## 2021-03-14 ENCOUNTER — Other Ambulatory Visit: Payer: Self-pay

## 2021-03-14 ENCOUNTER — Encounter (HOSPITAL_BASED_OUTPATIENT_CLINIC_OR_DEPARTMENT_OTHER): Payer: Self-pay

## 2021-03-14 DIAGNOSIS — I10 Essential (primary) hypertension: Secondary | ICD-10-CM | POA: Diagnosis not present

## 2021-03-14 DIAGNOSIS — R0789 Other chest pain: Secondary | ICD-10-CM | POA: Insufficient documentation

## 2021-03-14 DIAGNOSIS — R110 Nausea: Secondary | ICD-10-CM | POA: Diagnosis not present

## 2021-03-14 DIAGNOSIS — R079 Chest pain, unspecified: Secondary | ICD-10-CM

## 2021-03-14 DIAGNOSIS — I440 Atrioventricular block, first degree: Secondary | ICD-10-CM | POA: Diagnosis not present

## 2021-03-14 DIAGNOSIS — E119 Type 2 diabetes mellitus without complications: Secondary | ICD-10-CM | POA: Diagnosis not present

## 2021-03-14 LAB — COMPREHENSIVE METABOLIC PANEL
ALT: 20 U/L (ref 0–44)
AST: 24 U/L (ref 15–41)
Albumin: 4.5 g/dL (ref 3.5–5.0)
Alkaline Phosphatase: 71 U/L (ref 38–126)
Anion gap: 8 (ref 5–15)
BUN: 13 mg/dL (ref 8–23)
CO2: 30 mmol/L (ref 22–32)
Calcium: 9.7 mg/dL (ref 8.9–10.3)
Chloride: 100 mmol/L (ref 98–111)
Creatinine, Ser: 0.71 mg/dL (ref 0.61–1.24)
GFR, Estimated: >60 mL/min (ref >60)
Glucose, Bld: 95 mg/dL (ref 70–99)
Potassium: 4.3 mmol/L (ref 3.5–5.1)
Sodium: 138 mmol/L (ref 135–145)
Total Bilirubin: 1.4 mg/dL — ABNORMAL HIGH (ref 0.3–1.2)
Total Protein: 7.6 g/dL (ref 6.5–8.1)

## 2021-03-14 LAB — CBC WITH DIFFERENTIAL/PLATELET
Abs Immature Granulocytes: 0.01 10*3/uL (ref 0.00–0.07)
Basophils Absolute: 0.1 10*3/uL (ref 0.0–0.1)
Basophils Relative: 1 %
Eosinophils Absolute: 0.1 10*3/uL (ref 0.0–0.5)
Eosinophils Relative: 1 %
HCT: 46.9 % (ref 39.0–52.0)
Hemoglobin: 15.1 g/dL (ref 13.0–17.0)
Immature Granulocytes: 0 %
Lymphocytes Relative: 31 %
Lymphs Abs: 1.8 10*3/uL (ref 0.7–4.0)
MCH: 29 pg (ref 26.0–34.0)
MCHC: 32.2 g/dL (ref 30.0–36.0)
MCV: 90 fL (ref 80.0–100.0)
Monocytes Absolute: 0.6 10*3/uL (ref 0.1–1.0)
Monocytes Relative: 11 %
Neutro Abs: 3.4 10*3/uL (ref 1.7–7.7)
Neutrophils Relative %: 56 %
Platelets: 250 10*3/uL (ref 150–400)
RBC: 5.21 MIL/uL (ref 4.22–5.81)
RDW: 13 % (ref 11.5–15.5)
WBC: 6 10*3/uL (ref 4.0–10.5)
nRBC: 0 % (ref 0.0–0.2)

## 2021-03-14 LAB — TROPONIN I (HIGH SENSITIVITY)
Troponin I (High Sensitivity): 5 ng/L (ref <18)
Troponin I (High Sensitivity): 5 ng/L (ref <18)

## 2021-03-14 MED ORDER — FAMOTIDINE 20 MG PO TABS: 20.0000 mg | ORAL_TABLET | Freq: Two times a day (BID) | ORAL | 0 refills | Status: DC

## 2021-03-14 MED ORDER — ALUM & MAG HYDROXIDE-SIMETH 200-200-20 MG/5ML PO SUSP
30.0000 mL | Freq: Once | ORAL | Status: AC
  Administered 2021-03-14: 30 mL via ORAL
  Filled 2021-03-14: qty 30

## 2021-03-14 NOTE — ED Provider Notes (Cosign Needed)
[facility] EMERGENCY DEPARTMENT Provider Note   CSN: [id number] Arrival date & time: 03/14/21  0955     History  Chief Complaint  Patient presents with   Chest Pain    Adam Fleming is a 76 y.o. male.   Chest Pain  Patient with medical history notable for GERD and previous hyperlipidemia not currently requiring medication presents due to chest tightness.  Started this morning around 7 or 8 AM, its constant.  Is not related to position or exertion, there is no pain that radiates elsewhere.  There is some associated nausea but no vomiting.  He is not short of breath.  He does report having some intermittent dizziness for the past week that comes and goes.  States he had vertigo recently and it feels similar to that.  He is eating and drinking normally, walks about 3 miles daily.  No history of MI, does not smoke cigarettes, does not take medication for hypertension, hyperlipidemia or diabetes.  No family members with CAD before the age 74.  Home Medications Prior to Admission medications   Medication Sig Start Date End Date Taking? Authorizing Provider  famotidine (PEPCID) 20 MG tablet Take 1 tablet (20 mg total) by mouth 2 (two) times daily. 03/14/21  Yes Sherrill Raring, PA-C  cyclobenzaprine (FLEXERIL) 5 MG tablet Take 1 tablet (5 mg total) by mouth 3 (three) times daily as needed for muscle spasms. Patient not taking: Reported on 05/24/2017 08/30/15   Verner Mould, MD  LATANOPROST OP Place 1 drop into the left eye daily.    [provider]  Multiple Vitamins-Minerals (MENS MULTI VITAMIN & MINERAL) TABS Take by mouth daily.    [provider]      Allergies    Patient has no known allergies.    Review of Systems   Review of Systems  Cardiovascular:  Positive for chest pain.   Physical Exam Updated Vital Signs BP (!) 148/64 (BP Location: Right Arm)    Pulse (!) 52    Temp 97.7 F (36.5 C) (Oral)    Resp 16    Ht 6' (1.829 m)    Wt 80.3  kg    SpO2 99%    BMI 24.01 kg/m  Physical Exam Vitals and nursing note reviewed. Exam conducted with a chaperone present.  Constitutional:      Appearance: Normal appearance.  HENT:     Head: Normocephalic and atraumatic.  Eyes:     General: No scleral icterus.       Right eye: No discharge.        Left eye: No discharge.     Extraocular Movements: Extraocular movements intact.     Pupils: Pupils are equal, round, and reactive to light.  Cardiovascular:     Rate and Rhythm: Normal rate and regular rhythm.     Pulses: Normal pulses.     Heart sounds: Normal heart sounds. No murmur heard.   No friction rub. No gallop.  Pulmonary:     Effort: Pulmonary effort is normal. No respiratory distress.     Breath sounds: Normal breath sounds.  Abdominal:     General: Abdomen is flat. Bowel sounds are normal. There is no distension.     Palpations: Abdomen is soft.     Tenderness: There is no abdominal tenderness.  Skin:    General: Skin is warm and dry.     Coloration: Skin is not jaundiced.  Neurological:     Mental Status: He is alert.  Mental status is at baseline.     Coordination: Coordination normal.    ED Results / Procedures / Treatments   Labs (all labs ordered are listed, but only abnormal results are displayed) Labs Reviewed  COMPREHENSIVE METABOLIC PANEL - Abnormal; Notable for the following components:      Result Value   Total Bilirubin 1.4 (*)    All other components within normal limits  CBC WITH DIFFERENTIAL/PLATELET  TROPONIN I (HIGH SENSITIVITY)  TROPONIN I (HIGH SENSITIVITY)    EKG EKG Interpretation  Date/Time:  Saturday March 14 2021 10:12:21 EST Ventricular Rate:  62 PR Interval:  238 QRS Duration: 94 QT Interval:  382 QTC Calculation: 387 R Axis:   84 Text Interpretation: Sinus rhythm with 1st degree A-V block with Premature atrial complexes Otherwise normal ECG No previous ECGs available Confirmed by Campbell Stall ([id number]) on 3/66/2947 11:51:36  AM  Radiology DG Chest 2 View  Result Date: 03/14/2021 CLINICAL DATA:  Chest pain, dizziness, and weakness for several days. EXAM: CHEST - 2 VIEW COMPARISON:  07/31/2012 FINDINGS: The heart size and mediastinal contours are within normal limits. Both lungs are clear. The visualized skeletal structures are unremarkable. IMPRESSION: No active cardiopulmonary disease. Electronically Signed   By: Marlaine Hind M.D.   On: 03/14/2021 11:03    Procedures Procedures    Medications Ordered in ED Medications  alum & mag hydroxide-simeth (MAALOX/MYLANTA) 200-200-20 MG/5ML suspension 30 mL (30 mLs Oral Given 03/14/21 1201)    ED Course/ Medical Decision Making/ A&P                           Medical Decision Making Amount and/or Complexity of Data Reviewed Labs: ordered. Radiology: ordered.  Risk OTC drugs.   This patient presents to the ED for concern of chest tightness, this involves an extensive number of treatment options, and is a complaint that carries with it a high risk of complications and morbidity.  The differential diagnosis includes ACS, dissection, pneumonia, pneumothorax, costochondritis, GERD, esophageal rupture, other   Additional history obtained:   Independent historian: wife    Lab Tests:  I ordered, viewed, and personally interpreted labs.  The pertinent results include: No leukocytosis or anemia on CBC.  BMP without any gross electrolyte derangement or AKI.  Negative delta troponin.    Imaging Studies ordered:  I directly visualized the plain film, which showed no acute process  I agree with the radiologist interpretation    ECG/Cardiac monitoring:   Per my interpretation, EKG shows 1st degree AVB  The patient was maintained on a cardiac monitor.  Visualized monitor strip which showed sinus bradycardia with HR 50 per my interpretation.    Medicines ordered and prescription drug management:  I ordered medication including: Maalox    I have reviewed the  patients home medicines and have made adjustments as needed   Test Considered:  Heart score 3.   Discussed options for workup with patient, risks and benefits, via shared decision making decided to forgo admission for observation at this time.  We did engage in shared decision-making, patient states he would rather go home and follow-up with his PCP.    Reevaluation:  After the interventions noted above, I reevaluated the patient and found improved   Problems addressed / ED Course: 76 year old presenting due to chest tightness.  Physical exam is unremarkable.  Patient has mild bradycardia but his rhythm is regular.  EKG is without ischemic findings, negative delta Trope.  Ultimately doubt ACS.  Chest x-ray unrevealing, no underlying pneumothorax or pneumonia.  He is not febrile or having any symptoms that be suggestive of infection.  Lab work-up all within normal limit.  Patient reports feeling somewhat better after Maalox.  Will discharge with Pepcid and have him follow-up with his PCP for reevaluation Monday or Tuesday.   Social Determinants of Health:    Disposition:   After consideration of the diagnostic results and the patients response to treatment, I feel that the patent would benefit from D/C.            Final Clinical Impression(s) / ED Diagnoses Final diagnoses:  Chest pain, unspecified type    Rx / DC Orders ED Discharge Orders          Ordered    famotidine (PEPCID) 20 MG tablet  2 times daily        03/14/21 1404              Sherrill Raring, [place] 03/14/21 1412

## 2021-03-14 NOTE — ED Notes (Signed)
Pt in bed, pt reports decreased pain, pt states that he is ready to go home, pt from dpt with sig other  ?

## 2021-03-14 NOTE — ED Notes (Signed)
Pt ambulatory to and from bathroom, denies dizziness, has no requests ?

## 2021-03-14 NOTE — ED Triage Notes (Signed)
Chest tightness that started this morning, dizziness and weakness all week. ?

## 2021-03-14 NOTE — Discharge Instructions (Signed)
Your work-up today was reassuring, there were no signs of heart attack, pneumonia, pneumothorax or emergent source of your chest pain.  You should follow-up with your primary care for reevaluation on Monday or Tuesday of next week.  If the pain worsens or change return back to the ED for evaluation.  Start taking Pepcid daily for the next 14 days. ?

## 2021-03-18 DIAGNOSIS — G8929 Other chronic pain: Secondary | ICD-10-CM | POA: Insufficient documentation

## 2022-05-03 ENCOUNTER — Encounter: Payer: Self-pay | Admitting: Internal Medicine

## 2022-05-07 ENCOUNTER — Encounter: Payer: Self-pay | Admitting: Internal Medicine

## 2022-05-20 ENCOUNTER — Telehealth: Payer: Self-pay | Admitting: Internal Medicine

## 2022-05-20 ENCOUNTER — Ambulatory Visit (AMBULATORY_SURGERY_CENTER): Payer: Medicare HMO

## 2022-05-20 ENCOUNTER — Encounter: Payer: Self-pay | Admitting: Internal Medicine

## 2022-05-20 ENCOUNTER — Other Ambulatory Visit: Payer: Self-pay

## 2022-05-20 VITALS — Ht 72.0 in | Wt 177.0 lb

## 2022-05-20 DIAGNOSIS — Z800 Family history of malignant neoplasm of digestive organs: Secondary | ICD-10-CM

## 2022-05-20 DIAGNOSIS — Z86010 Personal history of colonic polyps: Secondary | ICD-10-CM

## 2022-05-20 MED ORDER — NA SULFATE-K SULFATE-MG SULF 17.5-3.13-1.6 GM/177ML PO SOLN: 1.0000 | Freq: Once | ORAL | 0 refills | Status: DC

## 2022-05-20 MED ORDER — NA SULFATE-K SULFATE-MG SULF 17.5-3.13-1.6 GM/177ML PO SOLN: 1.0000 | Freq: Once | ORAL | 0 refills | Status: AC

## 2022-05-20 NOTE — Progress Notes (Signed)
No egg or soy allergy known to patient  No issues known to pt with past sedation with any surgeries or procedures Patient denies ever being told they had issues or difficulty with intubation  No FH of Malignant Hyperthermia Pt is not on diet pills Pt is not on  home 02  Pt is not on blood thinners  Pt denies issues with constipation  No A fib or A flutter Have any cardiac testing pending--no  Pt is ambulatory   Patient's chart reviewed by Cathlyn Parsons CNRA prior to previsit and patient appropriate for the [facility].  Previsit completed and red dot placed by patient's name on their procedure day (on provider's schedule).      PV completed with patient. Prep instructions reviewed and sent out via mychart and to mailing address. Rx sent to [facility].

## 2022-05-20 NOTE — Telephone Encounter (Signed)
Call returned rx to [facility] with [facility] for price reduction

## 2022-05-20 NOTE — Telephone Encounter (Signed)
Patient called stating that he spoke with pharmacist for prep medication and states it is expensive. He is requesting a call back to see if there is any alternative. Please advise, thank you.

## 2022-06-07 NOTE — Telephone Encounter (Signed)
Inbound cal from patient with questions regarding prep.please advise.  Thank you

## 2022-06-07 NOTE — Telephone Encounter (Signed)
Pt states he ate a small handful of nuts on Sunday and wanted to know if he ruined his prep. Instructed pt to stay hydrated 8-10oz every hour from now till the day of procedure and reviewed list of what not to eat. Pt stated he understood instructions and will hydrate well from this point on.

## 2022-06-10 ENCOUNTER — Encounter: Payer: Self-pay | Admitting: Internal Medicine

## 2022-06-10 ENCOUNTER — Ambulatory Visit (AMBULATORY_SURGERY_CENTER): Payer: Medicare HMO | Admitting: Internal Medicine

## 2022-06-10 VITALS — BP 147/69 | HR 50 | Temp 97.1°F | Resp 13 | Ht 72.0 in | Wt 177.0 lb

## 2022-06-10 DIAGNOSIS — Z86010 Personal history of colonic polyps: Secondary | ICD-10-CM | POA: Diagnosis not present

## 2022-06-10 DIAGNOSIS — Z09 Encounter for follow-up examination after completed treatment for conditions other than malignant neoplasm: Secondary | ICD-10-CM | POA: Diagnosis present

## 2022-06-10 DIAGNOSIS — Z800 Family history of malignant neoplasm of digestive organs: Secondary | ICD-10-CM

## 2022-06-10 MED ORDER — SODIUM CHLORIDE 0.9 % IV SOLN: 500.0000 mL | Freq: Once | INTRAVENOUS | Status: DC

## 2022-06-10 NOTE — Op Note (Signed)
[facility] Patient Name: Adam Fleming Procedure Date: 06/10/2022 10:07 AM MRN: [id number] Endoscopist: Wilhemina Bonito. Marina Goodell , MD, [id number] Age: 77 Referring MD:  Date of Birth: 1945-02-07 Gender: Male Account #: [id number] Procedure:                Colonoscopy Indications:              High risk colon cancer surveillance: Personal                            history of non-advanced adenomas. Previous                            examinations 2009, 2014, 2019. Also, mother with                            history of colon cancer less than age 7 Medicines:                Monitored Anesthesia Care Procedure:                Pre-Anesthesia Assessment:                           - Prior to the procedure, a History and Physical                            was performed, and patient medications and                            allergies were reviewed. The patient's tolerance of                            previous anesthesia was also reviewed. The risks                            and benefits of the procedure and the sedation                            options and risks were discussed with the patient.                            All questions were answered, and informed consent                            was obtained. Prior Anticoagulants: The patient has                            taken no anticoagulant or antiplatelet agents. ASA                            Grade Assessment: II - A patient with mild systemic                            disease. After reviewing the risks and benefits,  the patient was deemed in satisfactory condition to                            undergo the procedure.                           After obtaining informed consent, the colonoscope                            was passed under direct vision. Throughout the                            procedure, the patient's blood pressure, pulse, and                            oxygen saturations were  monitored continuously. The                            CF [id number] #[id number] was introduced through the anus                            and advanced to the the cecum, identified by                            appendiceal orifice and ileocecal valve. The                            ileocecal valve, appendiceal orifice, and rectum                            were photographed. The quality of the bowel                            preparation was excellent. The colonoscopy was                            performed without difficulty. The patient tolerated                            the procedure well. The bowel preparation used was                            SUPREP via split dose instruction. Scope In: 10:18:18 AM Scope Out: 10:32:37 AM Scope Withdrawal Time: 0 hours 10 minutes 7 seconds  Total Procedure Duration: 0 hours 14 minutes 19 seconds  Findings:                 There were a few small sigmoid diverticula.                            Internal hemorrhoids noted on retroflexed view. The                            entire examined colon appeared otherwise normal on  direct and retroflexion views. Complications:            No immediate complications. Estimated blood loss:                            None. Estimated Blood Loss:     Estimated blood loss: none. Impression:               - The entire examined colon is normal on direct and                            retroflexion views save a few sigmoid diverticula                            and internal hemorrhoids. No polyps.                           - No specimens collected. Recommendation:           - Repeat colonoscopy in 5 years for surveillance.                           - Patient has a contact number available for                            emergencies. The signs and symptoms of potential                            delayed complications were discussed with the                            patient. Return to normal  activities tomorrow.                            Written discharge instructions were provided to the                            patient.                           - Resume previous diet.                           - Continue present medications. Wilhemina Bonito. Marina Goodell, MD 06/10/2022 10:37:49 AM This report has been signed electronically.

## 2022-06-10 NOTE — Progress Notes (Signed)
Sedate, gd SR, tolerated procedure well, VSS, report to RN 

## 2022-06-10 NOTE — Progress Notes (Signed)
HISTORY OF PRESENT ILLNESS:  Adam Fleming is a 77 y.o. male with personal history of adenomatous colon polyps and family history of colon cancer.  Now for surveillance colonoscopy  REVIEW OF SYSTEMS:  All non-GI ROS negative except for  Past Medical History:  Diagnosis Date   Abnormal PSA    eval by uro 01/2014 - WNL, monitor   Allergic rhinitis    Allergy    seasonal   Bradycardia    asx, cards eval 06/2014- Hr always in the 50's per pt.   Glaucoma    History of chicken pox    History of degenerative disc disease    Hyperlipidemia    past history borderline high many years ago - no meds    Internal hemorrhoids    Sciatica     Past Surgical History:  Procedure Laterality Date   COLONOSCOPY     2008,2014   TONSILLECTOMY  1955    Social History Adam Fleming  reports that he has never smoked. He has never used smokeless tobacco. He reports current alcohol use. He reports that he does not use drugs.  family history includes Colon cancer in his maternal aunt and maternal grandmother; Colon cancer (age of onset: 51) in his mother; Colon cancer (age of onset: 63) in his maternal aunt; Colon polyps in his father; Colonic polyp in his father; Heart disease (age of onset: 44) in his father.  No Known Allergies     PHYSICAL EXAMINATION: Vital signs: BP 115/61   Pulse (!) 53   Temp (!) 97.1 F (36.2 C)   Ht 6' (1.829 m)   Wt 177 lb (80.3 kg)   SpO2 100%   BMI 24.01 kg/m  General: Well-developed, well-nourished, no acute distress HEENT: Sclerae are anicteric, conjunctiva pink. Oral mucosa intact Lungs: Clear Heart: Regular Abdomen: soft, nontender, nondistended, no obvious ascites, no peritoneal signs, normal bowel sounds. No organomegaly. Extremities: No edema Psychiatric: alert and oriented x3. Cooperative     ASSESSMENT:  Personal history of adenomatous colon polyps and family history of colon cancer in mother less than age 7   PLAN:  Surveillance  colonoscopy

## 2022-06-10 NOTE — Patient Instructions (Addendum)
Resume previous diet.  Continue present medications.     YOU HAD AN ENDOSCOPIC PROCEDURE TODAY AT THE [facility]:   Refer to the procedure report that was given to you for any specific questions about what was found during the examination.  If the procedure report does not answer your questions, please call your gastroenterologist to clarify.  If you requested that your care partner not be given the details of your procedure findings, then the procedure report has been included in a sealed envelope for you to review at your convenience later.  YOU SHOULD EXPECT: Some feelings of bloating in the abdomen. Passage of more gas than usual.  Walking can help get rid of the air that was put into your GI tract during the procedure and reduce the bloating. If you had a lower endoscopy (such as a colonoscopy or flexible sigmoidoscopy) you may notice spotting of blood in your stool or on the toilet paper. If you underwent a bowel prep for your procedure, you may not have a normal bowel movement for a few days.  Please Note:  You might notice some irritation and congestion in your nose or some drainage.  This is from the oxygen used during your procedure.  There is no need for concern and it should clear up in a day or so.  SYMPTOMS TO REPORT IMMEDIATELY:  Following lower endoscopy (colonoscopy or flexible sigmoidoscopy):  Excessive amounts of blood in the stool  Significant tenderness or worsening of abdominal pains  Swelling of the abdomen that is new, acute  Fever of 100F or higher   For urgent or emergent issues, a gastroenterologist can be reached at any hour by calling [phone]. Do not use MyChart messaging for urgent concerns.    DIET:  We do recommend a small meal at first, but then you may proceed to your regular diet.  Drink plenty of fluids but you should avoid alcoholic beverages for 24 hours.  ACTIVITY:  You should plan to take it easy for the rest of today and you  should NOT DRIVE or use heavy machinery until tomorrow (because of the sedation medicines used during the test).    FOLLOW UP: Our staff will call the number listed on your records the next business day following your procedure.  We will call around 7:15- 8:00 am to check on you and address any questions or concerns that you may have regarding the information given to you following your procedure. If we do not reach you, we will leave a message.     If any biopsies were taken you will be contacted by phone or by letter within the next 1-3 weeks.  Please call us at [phone] if you have not heard about the biopsies in 3 weeks.    SIGNATURES/CONFIDENTIALITY: You and/or your care partner have signed paperwork which will be entered into your electronic medical record.  These signatures attest to the fact that that the information above on your After Visit Summary has been reviewed and is understood.  Full responsibility of the confidentiality of this discharge information lies with you and/or your care-partner. 

## 2022-06-10 NOTE — Progress Notes (Signed)
VS done by DT. No medical hx changes since PV.

## 2022-06-11 ENCOUNTER — Telehealth: Payer: Self-pay

## 2022-06-11 NOTE — Telephone Encounter (Signed)
  Follow up Call-     06/10/2022    9:45 AM  Call back number  Post procedure Call Back phone  # [phone]  Permission to leave phone message Yes     Patient questions:  Do you have a fever, pain , or abdominal swelling? No. Pain Score  0 *  Have you tolerated food without any problems? Yes.    Have you been able to return to your normal activities? Yes.    Do you have any questions about your discharge instructions: Diet   No. Medications  No. Follow up visit  No.  Do you have questions or concerns about your Care? No.  Actions: * If pain score is 4 or above: No action needed, pain <4.

## 2023-07-12 ENCOUNTER — Other Ambulatory Visit (HOSPITAL_BASED_OUTPATIENT_CLINIC_OR_DEPARTMENT_OTHER): Payer: Self-pay

## 2023-07-12 MED ORDER — AREXVY 120 MCG/0.5ML IM SUSR
0.5000 mL | Freq: Once | INTRAMUSCULAR | 0 refills | Status: AC
  Filled 2023-07-12: qty 0.5, 1d supply, fill #0

## 2023-09-27 IMAGING — DX DG CHEST 2V
2 series · 2 of 2 positions shown · non-contrast
Comparison: 07/31/2012

CLINICAL DATA: Chest pain, dizziness, and weakness for several
days.

EXAM:
CHEST - 2 VIEW

[chest pa]
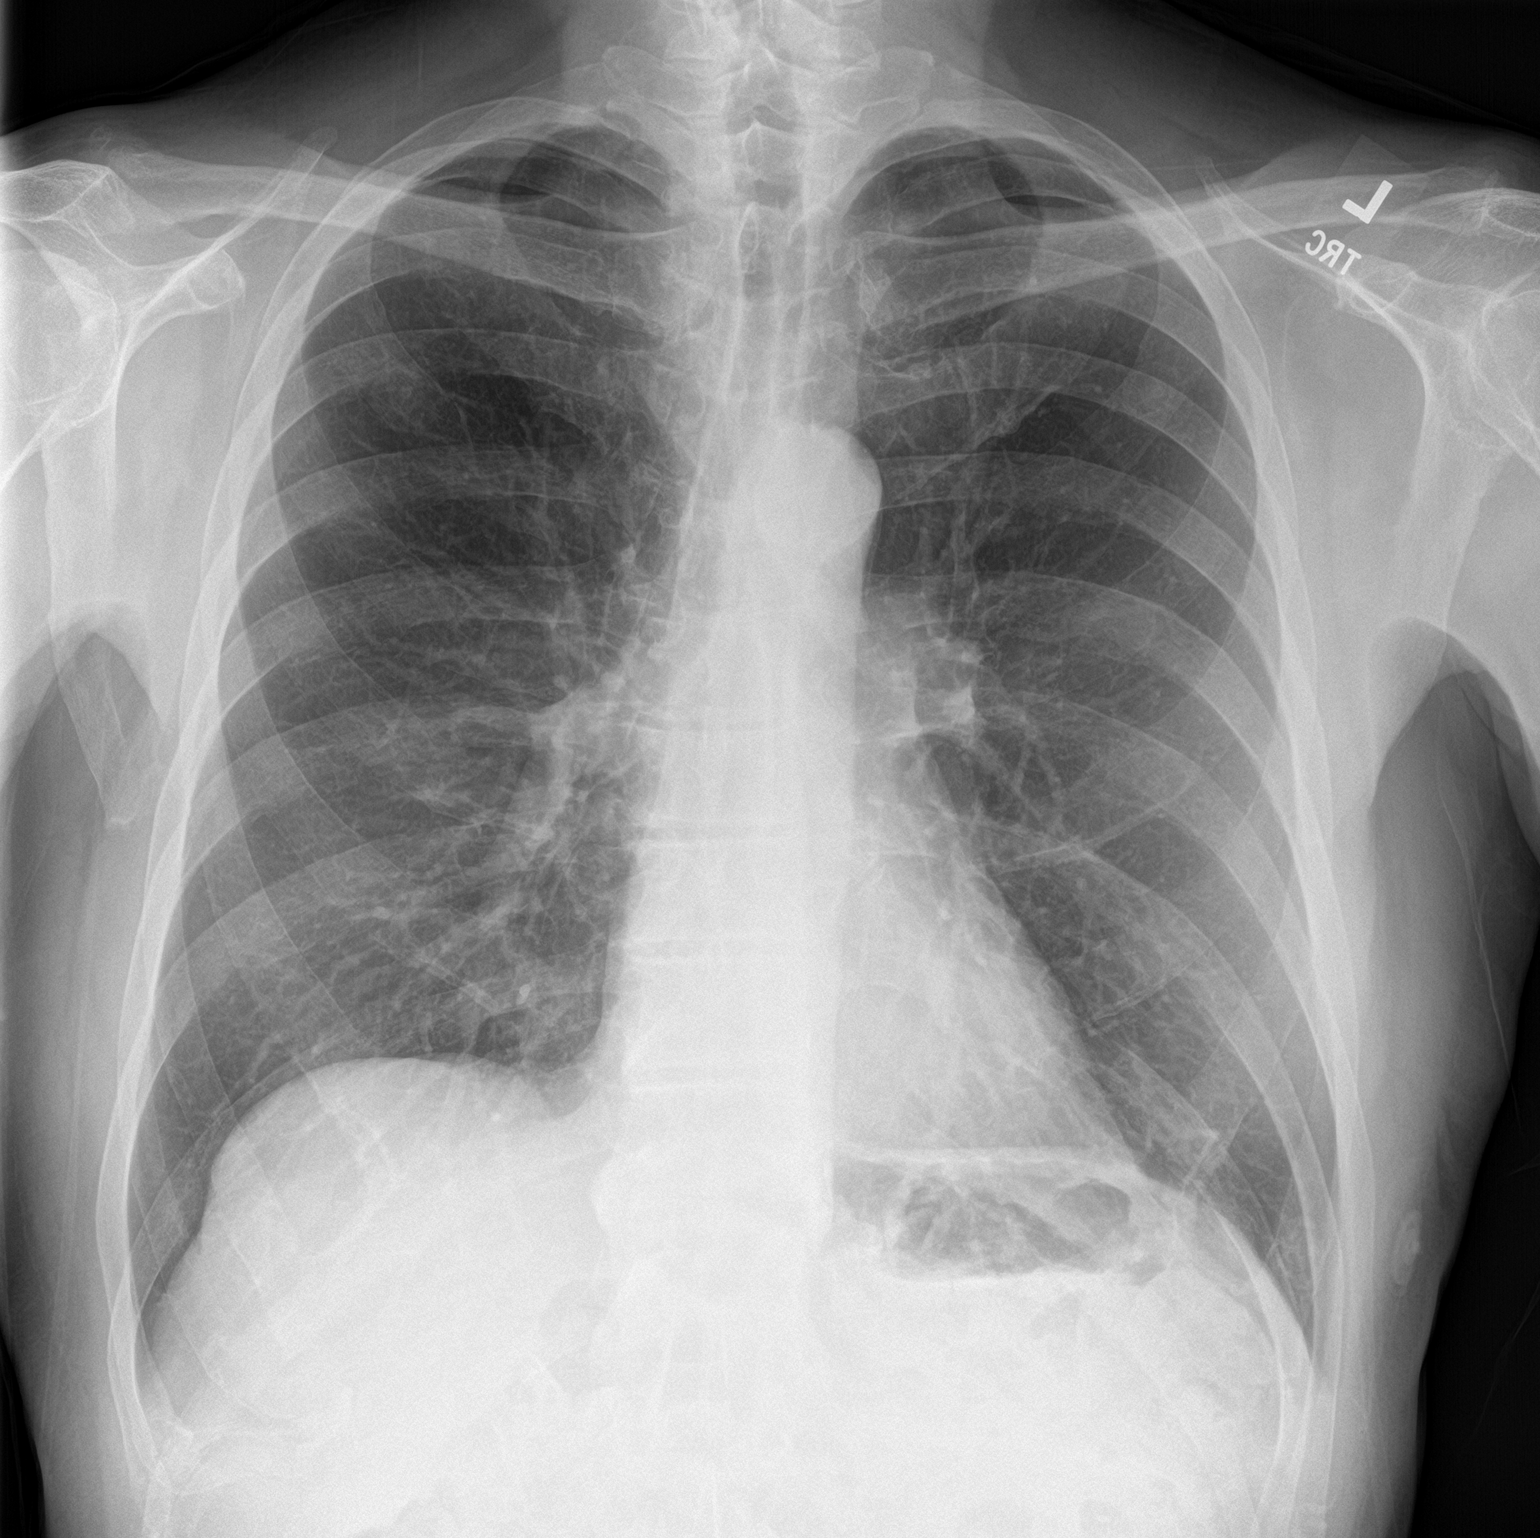

[chest lat]
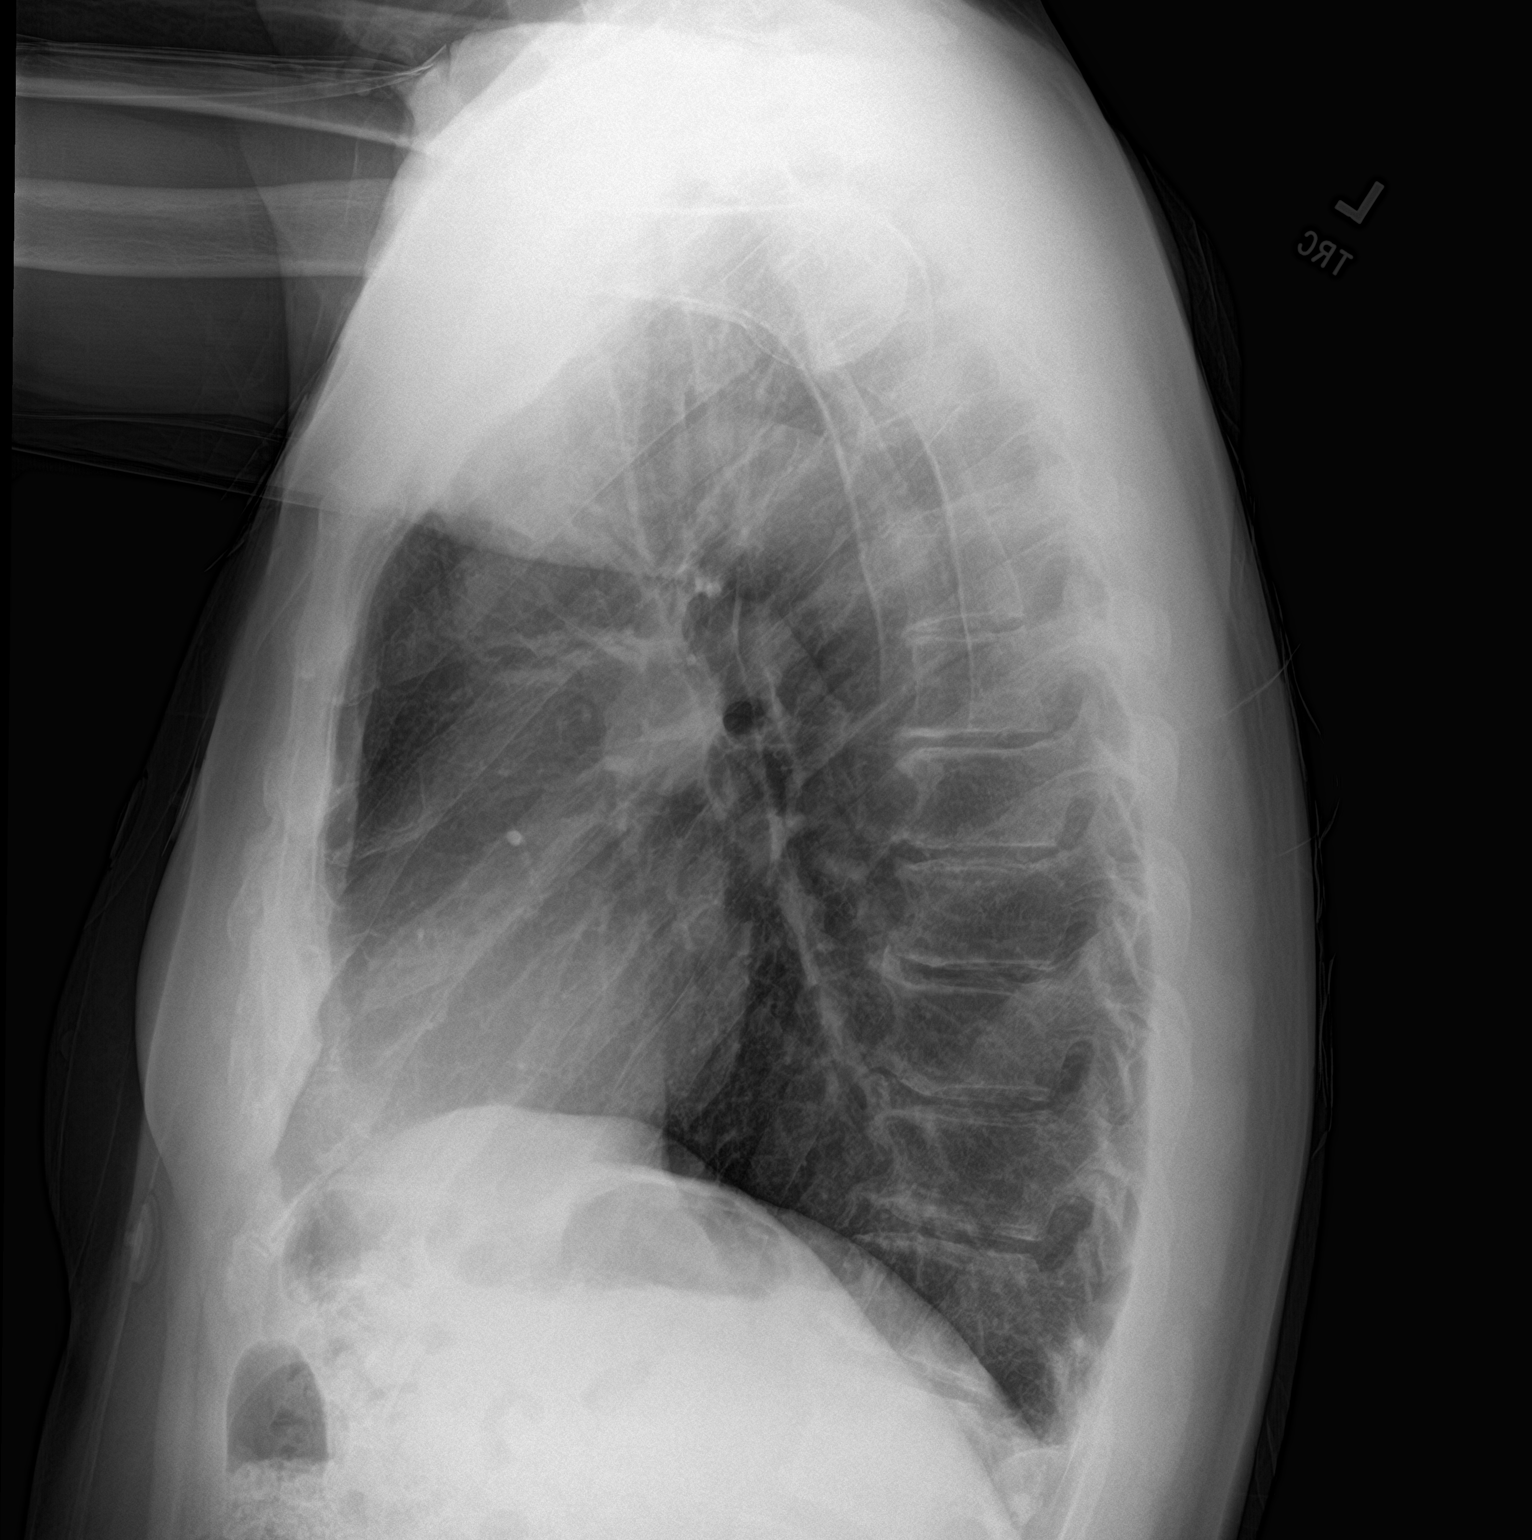

[2 of 2 positions shown; findings below may reference images not displayed]

FINDINGS: The heart size and mediastinal contours are within normal limits.
Both lungs are clear. The visualized skeletal structures are
unremarkable.
IMPRESSION: No active cardiopulmonary disease.
